# Patient Record
Sex: Female | Born: 1970 | Race: White | Hispanic: No | Marital: Married | Smoking: Never smoker
Health system: Southern US, Community
[De-identification: ages and names within clinical notes are randomized; demographics above are authoritative.]

## PROBLEM LIST (undated history)

## (undated) DIAGNOSIS — J189 Pneumonia, unspecified organism: Secondary | ICD-10-CM

## (undated) DIAGNOSIS — Z800 Family history of malignant neoplasm of digestive organs: Secondary | ICD-10-CM

## (undated) DIAGNOSIS — C801 Malignant (primary) neoplasm, unspecified: Secondary | ICD-10-CM

## (undated) DIAGNOSIS — Z8042 Family history of malignant neoplasm of prostate: Secondary | ICD-10-CM

## (undated) DIAGNOSIS — Z803 Family history of malignant neoplasm of breast: Secondary | ICD-10-CM

## (undated) DIAGNOSIS — C50919 Malignant neoplasm of unspecified site of unspecified female breast: Secondary | ICD-10-CM

## (undated) DIAGNOSIS — Z923 Personal history of irradiation: Secondary | ICD-10-CM

## (undated) HISTORY — DX: Personal history of irradiation: Z92.3

## (undated) HISTORY — PX: BREAST EXCISIONAL BIOPSY: SUR124

## (undated) HISTORY — DX: Family history of malignant neoplasm of prostate: Z80.42

## (undated) HISTORY — DX: Family history of malignant neoplasm of digestive organs: Z80.0

## (undated) HISTORY — PX: BREAST LUMPECTOMY: SHX2

## (undated) HISTORY — DX: Family history of malignant neoplasm of breast: Z80.3

## (undated) HISTORY — DX: Malignant neoplasm of unspecified site of unspecified female breast: C50.919

## (undated) HISTORY — PX: CHOLECYSTECTOMY: SHX55

## (undated) HISTORY — PX: TONSILLECTOMY: SUR1361

---

## 1997-09-13 ENCOUNTER — Inpatient Hospital Stay (HOSPITAL_COMMUNITY): Admission: AD | Admit: 1997-09-13 | Discharge: 1997-09-13 | Payer: Self-pay | Admitting: Obstetrics and Gynecology

## 1997-12-02 ENCOUNTER — Inpatient Hospital Stay (HOSPITAL_COMMUNITY): Admission: AD | Admit: 1997-12-02 | Discharge: 1997-12-07 | Payer: Self-pay | Admitting: Obstetrics and Gynecology

## 1998-01-05 ENCOUNTER — Encounter (HOSPITAL_COMMUNITY): Admission: RE | Admit: 1998-01-05 | Discharge: 1998-04-05 | Payer: Self-pay | Admitting: Obstetrics and Gynecology

## 1998-01-11 ENCOUNTER — Other Ambulatory Visit: Admission: RE | Admit: 1998-01-11 | Discharge: 1998-01-11 | Payer: Self-pay | Admitting: Obstetrics and Gynecology

## 1998-08-24 ENCOUNTER — Other Ambulatory Visit: Admission: RE | Admit: 1998-08-24 | Discharge: 1998-08-24 | Payer: Self-pay | Admitting: Obstetrics and Gynecology

## 1999-03-19 ENCOUNTER — Inpatient Hospital Stay (HOSPITAL_COMMUNITY): Admission: AD | Admit: 1999-03-19 | Discharge: 1999-03-19 | Payer: Self-pay | Admitting: Obstetrics and Gynecology

## 1999-03-22 ENCOUNTER — Inpatient Hospital Stay (HOSPITAL_COMMUNITY): Admission: AD | Admit: 1999-03-22 | Discharge: 1999-03-24 | Payer: Self-pay | Admitting: Obstetrics and Gynecology

## 1999-04-27 ENCOUNTER — Other Ambulatory Visit: Admission: RE | Admit: 1999-04-27 | Discharge: 1999-04-27 | Payer: Self-pay | Admitting: Obstetrics and Gynecology

## 1999-07-06 ENCOUNTER — Encounter (HOSPITAL_COMMUNITY): Admission: RE | Admit: 1999-07-06 | Discharge: 1999-10-04 | Payer: Self-pay | Admitting: Obstetrics and Gynecology

## 1999-11-30 ENCOUNTER — Other Ambulatory Visit: Admission: RE | Admit: 1999-11-30 | Discharge: 1999-11-30 | Payer: Self-pay | Admitting: Obstetrics and Gynecology

## 2000-06-19 ENCOUNTER — Other Ambulatory Visit: Admission: RE | Admit: 2000-06-19 | Discharge: 2000-06-19 | Payer: Self-pay | Admitting: Obstetrics and Gynecology

## 2001-06-30 ENCOUNTER — Other Ambulatory Visit: Admission: RE | Admit: 2001-06-30 | Discharge: 2001-06-30 | Payer: Self-pay | Admitting: Obstetrics and Gynecology

## 2002-09-10 ENCOUNTER — Other Ambulatory Visit: Admission: RE | Admit: 2002-09-10 | Discharge: 2002-09-10 | Payer: Self-pay | Admitting: Obstetrics and Gynecology

## 2003-01-14 ENCOUNTER — Ambulatory Visit (HOSPITAL_COMMUNITY): Admission: RE | Admit: 2003-01-14 | Discharge: 2003-01-14 | Payer: Self-pay | Admitting: Obstetrics and Gynecology

## 2003-01-14 ENCOUNTER — Encounter: Payer: Self-pay | Admitting: Obstetrics and Gynecology

## 2015-06-10 ENCOUNTER — Other Ambulatory Visit: Payer: Self-pay | Admitting: Obstetrics and Gynecology

## 2015-06-10 DIAGNOSIS — N6489 Other specified disorders of breast: Secondary | ICD-10-CM

## 2015-06-16 ENCOUNTER — Ambulatory Visit
Admission: RE | Admit: 2015-06-16 | Discharge: 2015-06-16 | Disposition: A | Discharge status: Home / Self Care | Payer: Managed Care, Other (non HMO) | Source: Ambulatory Visit | Attending: Obstetrics and Gynecology | Admitting: Obstetrics and Gynecology

## 2015-06-16 ENCOUNTER — Other Ambulatory Visit: Payer: Self-pay | Admitting: Obstetrics and Gynecology

## 2015-06-16 DIAGNOSIS — N6489 Other specified disorders of breast: Secondary | ICD-10-CM

## 2015-08-18 ENCOUNTER — Other Ambulatory Visit: Payer: Self-pay | Admitting: Obstetrics and Gynecology

## 2015-08-18 DIAGNOSIS — E041 Nontoxic single thyroid nodule: Secondary | ICD-10-CM

## 2015-08-23 ENCOUNTER — Ambulatory Visit
Admission: RE | Admit: 2015-08-23 | Discharge: 2015-08-23 | Disposition: A | Discharge status: Home / Self Care | Payer: Managed Care, Other (non HMO) | Source: Ambulatory Visit | Attending: Obstetrics and Gynecology | Admitting: Obstetrics and Gynecology

## 2015-08-23 DIAGNOSIS — E041 Nontoxic single thyroid nodule: Secondary | ICD-10-CM

## 2016-08-23 ENCOUNTER — Other Ambulatory Visit: Payer: Self-pay | Admitting: Obstetrics and Gynecology

## 2016-08-23 ENCOUNTER — Other Ambulatory Visit: Payer: Self-pay | Admitting: Neurosurgery

## 2016-08-23 DIAGNOSIS — Z1509 Genetic susceptibility to other malignant neoplasm: Principal | ICD-10-CM

## 2016-08-23 DIAGNOSIS — Z1501 Genetic susceptibility to malignant neoplasm of breast: Secondary | ICD-10-CM

## 2016-12-27 ENCOUNTER — Ambulatory Visit
Admission: RE | Admit: 2016-12-27 | Discharge: 2016-12-27 | Disposition: A | Discharge status: Home / Self Care | Payer: Managed Care, Other (non HMO) | Source: Ambulatory Visit | Attending: Obstetrics and Gynecology | Admitting: Obstetrics and Gynecology

## 2016-12-27 DIAGNOSIS — Z1509 Genetic susceptibility to other malignant neoplasm: Principal | ICD-10-CM

## 2016-12-27 DIAGNOSIS — Z1501 Genetic susceptibility to malignant neoplasm of breast: Secondary | ICD-10-CM

## 2016-12-27 MED ORDER — GADOBENATE DIMEGLUMINE 529 MG/ML IV SOLN
15.0000 mL | Freq: Once | INTRAVENOUS | Status: AC | PRN
  Administered 2016-12-27: 15 mL via INTRAVENOUS

## 2017-01-01 ENCOUNTER — Other Ambulatory Visit: Payer: Self-pay | Admitting: Obstetrics and Gynecology

## 2017-01-01 DIAGNOSIS — R928 Other abnormal and inconclusive findings on diagnostic imaging of breast: Secondary | ICD-10-CM

## 2017-01-07 ENCOUNTER — Inpatient Hospital Stay
Admission: RE | Admit: 2017-01-07 | Discharge: 2017-01-07 | Disposition: A | Discharge status: Home / Self Care | Payer: Managed Care, Other (non HMO) | Source: Ambulatory Visit | Attending: Obstetrics and Gynecology | Admitting: Obstetrics and Gynecology

## 2017-01-10 ENCOUNTER — Ambulatory Visit
Admission: RE | Admit: 2017-01-10 | Discharge: 2017-01-10 | Disposition: A | Discharge status: Home / Self Care | Payer: Managed Care, Other (non HMO) | Source: Ambulatory Visit | Attending: Obstetrics and Gynecology | Admitting: Obstetrics and Gynecology

## 2017-01-10 ENCOUNTER — Other Ambulatory Visit (HOSPITAL_COMMUNITY): Payer: Self-pay | Admitting: Diagnostic Radiology

## 2017-01-10 ENCOUNTER — Other Ambulatory Visit: Payer: Self-pay | Admitting: Obstetrics and Gynecology

## 2017-01-10 DIAGNOSIS — R928 Other abnormal and inconclusive findings on diagnostic imaging of breast: Secondary | ICD-10-CM

## 2017-01-10 MED ORDER — GADOBENATE DIMEGLUMINE 529 MG/ML IV SOLN
15.0000 mL | Freq: Once | INTRAVENOUS | Status: AC | PRN
  Administered 2017-01-10: 15 mL via INTRAVENOUS

## 2017-08-21 ENCOUNTER — Other Ambulatory Visit: Payer: Self-pay | Admitting: Family Medicine

## 2017-08-21 DIAGNOSIS — R000 Tachycardia, unspecified: Secondary | ICD-10-CM

## 2017-08-28 ENCOUNTER — Ambulatory Visit (INDEPENDENT_AMBULATORY_CARE_PROVIDER_SITE_OTHER): Payer: Managed Care, Other (non HMO)

## 2017-08-28 DIAGNOSIS — R000 Tachycardia, unspecified: Secondary | ICD-10-CM | POA: Diagnosis not present

## 2017-12-10 ENCOUNTER — Other Ambulatory Visit: Payer: Self-pay | Admitting: General Surgery

## 2017-12-10 DIAGNOSIS — N6021 Fibroadenosis of right breast: Secondary | ICD-10-CM

## 2018-01-01 ENCOUNTER — Ambulatory Visit
Admission: RE | Admit: 2018-01-01 | Discharge: 2018-01-01 | Disposition: A | Discharge status: Home / Self Care | Payer: Managed Care, Other (non HMO) | Source: Ambulatory Visit | Attending: General Surgery | Admitting: General Surgery

## 2018-01-01 DIAGNOSIS — N6021 Fibroadenosis of right breast: Secondary | ICD-10-CM

## 2018-01-01 MED ORDER — GADOBENATE DIMEGLUMINE 529 MG/ML IV SOLN
14.0000 mL | Freq: Once | INTRAVENOUS | Status: AC | PRN
  Administered 2018-01-01: 14 mL via INTRAVENOUS

## 2018-09-23 ENCOUNTER — Other Ambulatory Visit (HOSPITAL_COMMUNITY): Payer: Self-pay | Admitting: Obstetrics and Gynecology

## 2018-09-23 DIAGNOSIS — R011 Cardiac murmur, unspecified: Secondary | ICD-10-CM

## 2018-09-24 ENCOUNTER — Other Ambulatory Visit: Payer: Self-pay | Admitting: Obstetrics and Gynecology

## 2018-09-24 DIAGNOSIS — Z9189 Other specified personal risk factors, not elsewhere classified: Secondary | ICD-10-CM

## 2018-09-24 DIAGNOSIS — Z803 Family history of malignant neoplasm of breast: Secondary | ICD-10-CM

## 2018-09-29 ENCOUNTER — Ambulatory Visit (HOSPITAL_COMMUNITY): Payer: Managed Care, Other (non HMO)

## 2018-10-06 ENCOUNTER — Other Ambulatory Visit: Payer: Self-pay

## 2018-10-06 ENCOUNTER — Ambulatory Visit (HOSPITAL_COMMUNITY)
Admission: RE | Admit: 2018-10-06 | Discharge: 2018-10-06 | Disposition: A | Discharge status: Home / Self Care | Payer: Managed Care, Other (non HMO) | Source: Ambulatory Visit | Attending: Obstetrics and Gynecology | Admitting: Obstetrics and Gynecology

## 2018-10-06 DIAGNOSIS — I080 Rheumatic disorders of both mitral and aortic valves: Secondary | ICD-10-CM | POA: Insufficient documentation

## 2018-10-06 DIAGNOSIS — R011 Cardiac murmur, unspecified: Secondary | ICD-10-CM | POA: Diagnosis present

## 2019-03-09 DIAGNOSIS — M25561 Pain in right knee: Secondary | ICD-10-CM | POA: Insufficient documentation

## 2019-05-14 ENCOUNTER — Other Ambulatory Visit: Payer: Self-pay | Admitting: Sports Medicine

## 2019-05-14 DIAGNOSIS — M25561 Pain in right knee: Secondary | ICD-10-CM

## 2019-06-11 ENCOUNTER — Other Ambulatory Visit: Payer: Self-pay | Admitting: Obstetrics and Gynecology

## 2019-06-11 DIAGNOSIS — N6310 Unspecified lump in the right breast, unspecified quadrant: Secondary | ICD-10-CM

## 2019-06-11 DIAGNOSIS — N6320 Unspecified lump in the left breast, unspecified quadrant: Secondary | ICD-10-CM

## 2019-06-23 ENCOUNTER — Ambulatory Visit
Admission: RE | Admit: 2019-06-23 | Discharge: 2019-06-23 | Disposition: A | Discharge status: Home / Self Care | Payer: Managed Care, Other (non HMO) | Source: Ambulatory Visit | Attending: Obstetrics and Gynecology | Admitting: Obstetrics and Gynecology

## 2019-06-23 ENCOUNTER — Other Ambulatory Visit: Payer: Self-pay

## 2019-06-23 DIAGNOSIS — N6310 Unspecified lump in the right breast, unspecified quadrant: Secondary | ICD-10-CM

## 2019-06-23 DIAGNOSIS — N6320 Unspecified lump in the left breast, unspecified quadrant: Secondary | ICD-10-CM

## 2019-06-30 ENCOUNTER — Other Ambulatory Visit: Payer: Self-pay

## 2020-11-21 ENCOUNTER — Ambulatory Visit (INDEPENDENT_AMBULATORY_CARE_PROVIDER_SITE_OTHER): Payer: Managed Care, Other (non HMO)

## 2020-11-21 ENCOUNTER — Encounter: Payer: Self-pay | Admitting: Podiatry

## 2020-11-21 ENCOUNTER — Ambulatory Visit (INDEPENDENT_AMBULATORY_CARE_PROVIDER_SITE_OTHER): Payer: Managed Care, Other (non HMO) | Admitting: Podiatry

## 2020-11-21 ENCOUNTER — Other Ambulatory Visit: Payer: Self-pay

## 2020-11-21 DIAGNOSIS — L84 Corns and callosities: Secondary | ICD-10-CM | POA: Diagnosis not present

## 2020-11-21 DIAGNOSIS — R52 Pain, unspecified: Secondary | ICD-10-CM

## 2020-11-21 DIAGNOSIS — M7671 Peroneal tendinitis, right leg: Secondary | ICD-10-CM

## 2020-11-21 DIAGNOSIS — M722 Plantar fascial fibromatosis: Secondary | ICD-10-CM

## 2020-11-21 NOTE — Patient Instructions (Signed)
Look for urea 40% cream or ointment and apply to the thickened dry skin / calluses. This can be bought over the counter, at a pharmacy or online such as [employer].    Look for shoes that are for "stability control" "motion control" or "over-supinators"

## 2020-11-22 ENCOUNTER — Ambulatory Visit: Payer: Managed Care, Other (non HMO) | Admitting: Podiatry

## 2020-11-23 ENCOUNTER — Encounter: Payer: Self-pay | Admitting: Podiatry

## 2020-11-23 NOTE — Progress Notes (Signed)
  Subjective:  Patient ID: Veronica Santos, female    DOB: Apr 23, 1970,  MRN: [id number]  Chief Complaint  Patient presents with   Foot Pain    Right heel pain x 1 month    50 y.o. female presents with the above complaint. History confirmed with patient.  She also has a small hard lesion on the side of the heel she tried to shave it a little bit.  She describes the ankle pain as burning and radiating pain that is worse with activity, overall has been getting better  Objective:  Physical Exam: warm, good capillary refill, no trophic changes or ulcerative lesions, normal DP and PT pulses, and normal sensory exam. Left Foot: normal exam, no swelling, tenderness, instability; ligaments intact, full range of motion of all ankle/foot joints Right Foot:  Mild pain over the peroneal tendons and small callus on the plantar lateral heel consistent with a porokeratosis    Radiographs: Multiple views x-ray of the right foot: no fracture, dislocation, swelling or degenerative changes noted Assessment:   1. Peroneal tendinitis of right lower leg   2. Callus of foot      Plan:  Patient was evaluated and treated and all questions answered.  Discussed the etiology and treatment options for peroneal tendinitis including stretching, formal physical therapy with an eccentric exercises therapy plan, supportive shoegears such as a running shoe or sneaker, topical and oral medications.  Overall has been getting much better for her think a change in shoe gears with stability control and motion control to prevent oversupination will help her quite a bit.  All symptomatic hyperkeratoses were safely debrided with a sterile #15 blade to patient's level of comfort without incident. We discussed preventative and palliative care of these lesions including supportive and accommodative shoegear, padding, prefabricated and custom molded accommodative orthoses, use of a pumice stone and lotions/creams daily.  Recommended  urea cream for these areas with a pumice stone   Return if symptoms worsen or fail to improve.

## 2021-01-04 ENCOUNTER — Other Ambulatory Visit: Payer: Self-pay | Admitting: Obstetrics and Gynecology

## 2021-01-04 DIAGNOSIS — Z803 Family history of malignant neoplasm of breast: Secondary | ICD-10-CM

## 2021-01-27 ENCOUNTER — Ambulatory Visit
Admission: RE | Admit: 2021-01-27 | Discharge: 2021-01-27 | Disposition: A | Discharge status: Home / Self Care | Payer: Managed Care, Other (non HMO) | Source: Ambulatory Visit | Attending: Obstetrics and Gynecology | Admitting: Obstetrics and Gynecology

## 2021-01-27 DIAGNOSIS — Z803 Family history of malignant neoplasm of breast: Secondary | ICD-10-CM

## 2021-01-27 MED ORDER — GADOBUTROL 1 MMOL/ML IV SOLN
7.0000 mL | Freq: Once | INTRAVENOUS | Status: AC | PRN
  Administered 2021-01-27: 7 mL via INTRAVENOUS

## 2021-01-30 ENCOUNTER — Other Ambulatory Visit: Payer: Self-pay | Admitting: Obstetrics and Gynecology

## 2021-01-30 DIAGNOSIS — R9389 Abnormal findings on diagnostic imaging of other specified body structures: Secondary | ICD-10-CM

## 2021-02-08 ENCOUNTER — Ambulatory Visit
Admission: RE | Admit: 2021-02-08 | Discharge: 2021-02-08 | Disposition: A | Discharge status: Home / Self Care | Payer: Managed Care, Other (non HMO) | Source: Ambulatory Visit | Attending: Obstetrics and Gynecology | Admitting: Obstetrics and Gynecology

## 2021-02-08 ENCOUNTER — Other Ambulatory Visit: Payer: Self-pay

## 2021-02-08 ENCOUNTER — Other Ambulatory Visit (HOSPITAL_COMMUNITY): Payer: Self-pay | Admitting: Diagnostic Radiology

## 2021-02-08 DIAGNOSIS — R9389 Abnormal findings on diagnostic imaging of other specified body structures: Secondary | ICD-10-CM

## 2021-02-08 HISTORY — PX: BREAST BIOPSY: SHX20

## 2021-02-08 MED ORDER — GADOBUTROL 1 MMOL/ML IV SOLN
7.0000 mL | Freq: Once | INTRAVENOUS | Status: AC | PRN
  Administered 2021-02-08: 7 mL via INTRAVENOUS

## 2021-02-13 ENCOUNTER — Encounter: Payer: Self-pay | Admitting: *Deleted

## 2021-02-13 ENCOUNTER — Other Ambulatory Visit: Payer: Self-pay | Admitting: *Deleted

## 2021-02-13 ENCOUNTER — Telehealth: Payer: Self-pay | Admitting: *Deleted

## 2021-02-13 NOTE — Telephone Encounter (Signed)
Received call from patient stating she was just diagnosed with breast cancer and wanted some information on her next steps. She is scheduled with Dr. Marlou Starks 11/29. She has seen him in the past for a benign lesion.  Discussed navigation resources, needs and concerns. Gave contact information.  She is requesting Dr. Lindi Adie and we will get her an appt with him and radiation oncology (she requests dr. Isidore Moos).  Encouraged her to should she have any questions or concerns. Patient verbalized understanding.

## 2021-02-14 ENCOUNTER — Other Ambulatory Visit: Payer: Self-pay | Admitting: *Deleted

## 2021-02-14 ENCOUNTER — Telehealth: Payer: Self-pay | Admitting: Hematology and Oncology

## 2021-02-14 NOTE — Telephone Encounter (Signed)
Scheduled appt per 11/21 staff msg from [employer]. Pt is aware of appt date and time.

## 2021-02-21 ENCOUNTER — Other Ambulatory Visit: Payer: Self-pay | Admitting: *Deleted

## 2021-02-21 DIAGNOSIS — C50511 Malignant neoplasm of lower-outer quadrant of right female breast: Secondary | ICD-10-CM | POA: Insufficient documentation

## 2021-02-21 DIAGNOSIS — Z170 Estrogen receptor positive status [ER+]: Secondary | ICD-10-CM | POA: Insufficient documentation

## 2021-02-21 NOTE — Progress Notes (Signed)
Radiation Oncology         [phone] ________________________________  Initial Outpatient Consultation  Name: Veronica Santos MRN: [id number]  Date: 02/22/2021  DOB: [birth year]  CC:Gaynelle Arabian, MD  Nicholas Lose, MD   REFERRING PHYSICIAN: Nicholas Lose, MD  DIAGNOSIS:    ICD-10-CM   1. Malignant neoplasm of lower-outer quadrant of right breast of female, estrogen receptor positive ([place])  C50.511 Ambulatory Referral to Genetics   Z17.0       Cancer Staging  Malignant neoplasm of lower-outer quadrant of right breast of female, estrogen receptor positive ([place]) Staging form: Breast, AJCC 8th Edition - Clinical stage from 02/22/2021: Stage IA (cT1c, cN0, cM0, G2, ER+, PR+, HER2-) - Signed by Nicholas Lose, MD on 02/22/2021 Stage prefix: Initial diagnosis Histologic grading system: 3 grade system   CHIEF COMPLAINT: Here to discuss management of right breast cancer  HISTORY OF PRESENT ILLNESS::Veronica Santos is a 50 y.o. female who presented with breast abnormality on the following imaging: bilateral breast MRI on the date of 01/27/21 which showed a dominant mass in the inferolateral right breast measuring 11 mm, and a mass in the left breast retroareolar region measuring 8.6 mm.  MRI was prompted due to the patient's high genetic risk of breast cancer (increased lifetime risk between 23 and 29%).  Lymph nodes appeared negative on MRI.  Right breast LOQ biopsy on the date of 02/08/21 showed invasive mammary carcinoma measuring 0.7 cm in the greatest linear extent, and mammary carcinoma in-situ. Left breast retroareolar biopsy also performed revealed tubular adenoma.  ER status: 100% positive; PR status 100% positive, both with strong staining intensity, Her2 status negative; Grade 2.  No lymph nodes were examined.   The patient met with Dr. Marlou Starks on 02/21/21 to discuss further treatment options. Following discussion of her options, the patient voiced interest in proceeding with breast  conserving surgery. Dr. Marlou Starks also recommended SLN biopsies and that the patient may likely benefit from neoadjuvant chemotherapy upfront due to her high genetic risk factors.  She has not yet been referred to genetic counseling.  Her receptor status just became available and was not accessible at the time that she consulted with Dr. Marlou Starks.  She reports that her partner has had a vasectomy and denies current pregnancy.  She has not gone through menopause.  She denies any pain.  She denies hormonal birth control.  She denies prior radiation.  PREVIOUS RADIATION THERAPY: No  PAST MEDICAL HISTORY: Pain in right knee  PAST SURGICAL HISTORY: Past Surgical History:  Procedure Laterality Date   BREAST BIOPSY Left 02/08/2021   BREAST BIOPSY Right 02/08/2021    FAMILY HISTORY: Positive family history for cancer, genetic counseling pending  SOCIAL HISTORY:  reports that she has never smoked. She has never used smokeless tobacco. She reports current alcohol use. She reports that she does not use drugs.  ALLERGIES: Penicillins and Sulfa antibiotics  MEDICATIONS: She is not currently taking medication  REVIEW OF SYSTEMS: As above in HPI.   PHYSICAL EXAM:  height is $RemoveB'5\' 7"'uhrNVxZm$  (1.702 m) and weight is 156 lb 3.2 oz (70.9 kg). Her temperature is 97.9 F (36.6 C). Her blood pressure is 129/82 and her pulse is 88. Her respiration is 18 and oxygen saturation is 98%.   General: Alert and oriented, in no acute distress  Neck: Neck is supple, no palpable cervical or supraclavicular lymphadenopathy. Heart: Regular in rate and rhythm with no murmurs, rubs, or gallops. Chest: Clear to auscultation bilaterally, with no rhonchi, wheezes,  or rales. Extremities: No cyanosis or edema. Lymphatics: see Neck Exam Skin: No concerning lesions. Musculoskeletal: symmetric strength and muscle tone throughout. Neurologic: Cranial nerves II through XII are grossly intact. No obvious focalities. Speech is fluent. Coordination is  intact. Psychiatric: Judgment and insight are intact. Affect is appropriate. Breasts: There is a subcentimeter spherical mass superficially in the right breast at the 6:30 position.  There is firmness in the 9:00 region of the left retroareolar tissue. No other palpable masses appreciated in the breasts or axillae bilaterally.  There is bruising associated with bilateral breast biopsies   ECOG = 0  0 - Asymptomatic (Fully active, able to carry on all predisease activities without restriction)  1 - Symptomatic but completely ambulatory (Restricted in physically strenuous activity but ambulatory and able to carry out work of a light or sedentary nature. For example, light housework, office work)  2 - Symptomatic, <50% in bed during the day (Ambulatory and capable of all self care but unable to carry out any work activities. Up and about more than 50% of waking hours)  3 - Symptomatic, >50% in bed, but not bedbound (Capable of only limited self-care, confined to bed or chair 50% or more of waking hours)  4 - Bedbound (Completely disabled. Cannot carry on any self-care. Totally confined to bed or chair)  5 - Death   Eustace Pen MM, Creech RH, Tormey DC, et al. [phone]). "Toxicity and response criteria of the [facility]". [place] Oncol. 5 (6): 649-55   LABORATORY DATA:  No results found for: WBC, HGB, HCT, MCV, PLT CMP  No results found for: NA, K, CL, CO2, GLUCOSE, BUN, CREATININE, CALCIUM, PROT, ALBUMIN, AST, ALT, ALKPHOS, BILITOT, GFRNONAA, GFRAA       RADIOGRAPHY: MR BREAST BILATERAL W WO CONTRAST INC CAD  Result Date: 01/27/2021 CLINICAL DATA:  High risk screening. Increased lifetime risk of breast cancer calculated between 23 and 29%. History of right breast needle biopsy demonstrating a complex sclerosing lesion. Family history of breast cancer. LABS:  None EXAM: BILATERAL BREAST MRI WITH AND WITHOUT CONTRAST TECHNIQUE: Multiplanar, multisequence MR images of both  breasts were obtained prior to and following the intravenous administration of 7 ml of Gadavist Three-dimensional MR images were rendered by post-processing of the original MR data on an independent workstation. The three-dimensional MR images were interpreted, and findings are reported in the following complete MRI report for this study. Three dimensional images were evaluated at the independent interpreting workstation using the DynaCAD thin client. COMPARISON:  Previous exam(s). FINDINGS: Breast composition: d. Extreme fibroglandular tissue. Background parenchymal enhancement: Marked Right breast: Marked background enhancement is identified with innumerable foci and small masses. There is a dominant indeterminate mass in the inferolateral right breast seen on series 6, image 115 measuring up to 11 mm. No other discrete abnormalities identified on the right. Left breast: Marked background enhancement is identified within numerable small foci and small masses. There is a mass in the left retroareolar region on series 6, image 77 which is indeterminate, measuring up to 8.6 mm. No other suspicious discrete findings on the left. Lymph nodes: No abnormal appearing lymph nodes. Ancillary findings:  None. IMPRESSION: The study is limited due to marked background enhancement as above. There is a dominant mass in the inferolateral right breast on series 6, image 115 measuring up to 11 mm. There is a mass in the left retroareolar region on series 6, image 77 measuring 8.6 mm. RECOMMENDATION: Recommend MRI guided biopsy of  the right breast mass on series 6, image 115. Recommend MRI guided biopsy of the left breast mass on series 6, image 77. BI-RADS CATEGORY  4: Suspicious. Electronically Signed   By: Dorise Bullion III M.D.   On: 01/27/2021 15:31  MM CLIP PLACEMENT LEFT  Result Date: 02/08/2021 CLINICAL DATA:  Status post bilateral MRI guided core needle breast biopsies. EXAM: 3D DIAGNOSTIC BILATERAL MAMMOGRAM POST  MRI BIOPSY X 2 COMPARISON:  Previous exam(s). FINDINGS: 3D Mammographic images were obtained following MR guided biopsy of a mass in each breast. The cylinder shaped biopsy marking clip is located approximately 3.5 cm medial to the expected location of the biopsy on the right. The cylinder-shaped biopsy marker clip on the left is approximately 7 mm anterior to the expected location of the biopsy on the left. IMPRESSION: Approximately 3.5 cm of medial migration of the cylinder shaped clip in the right breast and approximately 7 mm of anterior migration of the cylinder-shaped biopsy clip in the left breast. Final Assessment: Post Procedure Mammograms for Marker Placement Electronically Signed   By: Claudie Revering M.D.   On: 02/08/2021 10:45  MM CLIP PLACEMENT RIGHT  Result Date: 02/08/2021 CLINICAL DATA:  Status post bilateral MRI guided core needle breast biopsies. EXAM: 3D DIAGNOSTIC BILATERAL MAMMOGRAM POST MRI BIOPSY X 2 COMPARISON:  Previous exam(s). FINDINGS: 3D Mammographic images were obtained following MR guided biopsy of a mass in each breast. The cylinder shaped biopsy marking clip is located approximately 3.5 cm medial to the expected location of the biopsy on the right. The cylinder-shaped biopsy marker clip on the left is approximately 7 mm anterior to the expected location of the biopsy on the left. IMPRESSION: Approximately 3.5 cm of medial migration of the cylinder shaped clip in the right breast and approximately 7 mm of anterior migration of the cylinder-shaped biopsy clip in the left breast. Final Assessment: Post Procedure Mammograms for Marker Placement Electronically Signed   By: Claudie Revering M.D.   On: 02/08/2021 10:45  MR LT BREAST BX W LOC DEV 1ST LESION IMAGE BX SPEC MR GUIDE  Addendum Date: 02/09/2021   ADDENDUM REPORT: 02/09/2021 14:44 ADDENDUM: Pathology revealed GRADE II INVASIVE MAMMARY CARCINOMA, MAMMARY CARCINOMA IN SITU, CALCIFICATIONS of the RIGHT breast, lower outer  quadrant, (cylinder clip). This was found to be concordant by Dr. Claudie Revering. 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 This was found to be concordant by Dr. Claudie Revering. Pathology results were discussed with the patient by telephone. The patient reported doing well after the biopsies with tenderness at BILATERAL sites, and bleeding and bruising on the RIGHT. Post biopsy instructions and care were reviewed and questions were answered. The patient was encouraged to call [facility] for any additional concerns. My direct phone number was provided. Surgical consultation has been arranged with Dr. Autumn Messing, per patient request, at [facility] on February 21, 2021. Pathology results reported by Terie Purser, RN on 02/09/2021. Electronically Signed   By: Claudie Revering M.D.   On: 02/09/2021 14:44   Result Date: 02/09/2021 CLINICAL DATA:  11 mm indeterminate mass in the lower outer quadrant of the right breast and 9 mm indeterminate mass in the retroareolar left breast on a recent high risk screening MRI. EXAM: MRI GUIDED CORE NEEDLE BIOPSIES OF BOTH BREASTS TECHNIQUE: Multiplanar, multisequence MR imaging of both breasts was performed both before and after administration of intravenous contrast. CONTRAST:  40mL GADAVIST GADOBUTROL 1 MMOL/ML  IV SOLN COMPARISON:  Previous exams. FINDINGS: I met with the patient, and we discussed the procedure of MRI guided biopsy, including risks, benefits, and alternatives. Specifically, we discussed the risks of infection, bleeding, tissue injury, clip migration, and inadequate sampling. Informed, written consent was given. The usual time out protocol was performed immediately prior to the procedures. SITE 1: 11 MM MASS IN THE LOWER OUTER QUADRANT OF THE RIGHT BREAST Using sterile technique, 1% Lidocaine, MRI guidance, and a 9 gauge vacuum assisted device, biopsy was performed of the recently  demonstrated 11 mm mass in the lower outer quadrant of the right breast using a lateral approach. At the conclusion of the procedure, a cylinder shaped tissue marker clip was deployed into the biopsy cavity. Follow-up 2-view mammogram was performed and dictated separately. SITE 2: 9 MM MASS IN THE RETROAREOLAR LEFT BREAST Using sterile technique, 1% Lidocaine, MRI guidance, and a 9 gauge vacuum assisted device, biopsy was performed of the recently demonstrated 9 mm mass in the lateral retroareolar left breast using a lateral approach. At the conclusion of the procedure, a cylinder shaped tissue marker clip was deployed into the biopsy cavity. Follow-up 2-view mammogram was performed and dictated separately. IMPRESSION: MRI guided biopsies of both breasts.  No apparent complications. Electronically Signed: By: Claudie Revering M.D. On: 02/08/2021 10:34  MR RT BREAST BX W LOC DEV 1ST LESION IMAGE BX SPEC MR GUIDE  Addendum Date: 02/09/2021   ADDENDUM REPORT: 02/09/2021 14:44 ADDENDUM: Pathology revealed GRADE II INVASIVE MAMMARY CARCINOMA, MAMMARY CARCINOMA IN SITU, CALCIFICATIONS of the RIGHT breast, lower outer quadrant, (cylinder clip). This was found to be concordant by Dr. Claudie Revering. 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 This was found to be concordant by Dr. Claudie Revering. Pathology results were discussed with the patient by telephone. The patient reported doing well after the biopsies with tenderness at BILATERAL sites, and bleeding and bruising on the RIGHT. Post biopsy instructions and care were reviewed and questions were answered. The patient was encouraged to call [facility] for any additional concerns. My direct phone number was provided. Surgical consultation has been arranged with Dr. Autumn Messing, per patient request, at [facility] on February 21, 2021. Pathology results reported by Terie Purser, RN on 02/09/2021.  Electronically Signed   By: Claudie Revering M.D.   On: 02/09/2021 14:44   Result Date: 02/09/2021 CLINICAL DATA:  11 mm indeterminate mass in the lower outer quadrant of the right breast and 9 mm indeterminate mass in the retroareolar left breast on a recent high risk screening MRI. EXAM: MRI GUIDED CORE NEEDLE BIOPSIES OF BOTH BREASTS TECHNIQUE: Multiplanar, multisequence MR imaging of both breasts was performed both before and after administration of intravenous contrast. CONTRAST:  66mL GADAVIST GADOBUTROL 1 MMOL/ML IV SOLN COMPARISON:  Previous exams. FINDINGS: I met with the patient, and we discussed the procedure of MRI guided biopsy, including risks, benefits, and alternatives. Specifically, we discussed the risks of infection, bleeding, tissue injury, clip migration, and inadequate sampling. Informed, written consent was given. The usual time out protocol was performed immediately prior to the procedures. SITE 1: 11 MM MASS IN THE LOWER OUTER QUADRANT OF THE RIGHT BREAST Using sterile technique, 1% Lidocaine, MRI guidance, and a 9 gauge vacuum assisted device, biopsy was performed of the recently demonstrated 11 mm mass in the lower outer quadrant of the right breast using a lateral approach. At the conclusion of the procedure, a cylinder  shaped tissue marker clip was deployed into the biopsy cavity. Follow-up 2-view mammogram was performed and dictated separately. SITE 2: 9 MM MASS IN THE RETROAREOLAR LEFT BREAST Using sterile technique, 1% Lidocaine, MRI guidance, and a 9 gauge vacuum assisted device, biopsy was performed of the recently demonstrated 9 mm mass in the lateral retroareolar left breast using a lateral approach. At the conclusion of the procedure, a cylinder shaped tissue marker clip was deployed into the biopsy cavity. Follow-up 2-view mammogram was performed and dictated separately. IMPRESSION: MRI guided biopsies of both breasts.  No apparent complications. Electronically Signed: By: Claudie Revering M.D. On: 02/08/2021 10:34     IMPRESSION/PLAN: This a delightful 50 year old woman with a new diagnosis of ER positive right breast cancer, stage I  She has been undergoing MRI screening due to elevated risk and family history.  I have placed a referral to genetic counseling.  She is open to pursuing bilateral mastectomies if her risk is found to be more elevated than originally estimated, after genetic testing  She understands that if she undergoes mastectomies it is unlikely that she would need postoperative radiation however we did review the indications for postmastectomy radiation.  We then had a lengthy discussion regarding breast conserving surgery and adjuvant therapy.  Fortunately her receptors are favorable and we are hopeful that she will not need chemotherapy.  Of course this will be determined under the guidance of Dr. Lindi Adie with likely Oncotype testing.  Assuming she undergoes breast conserving surgery, I recommend radiotherapy to the right breast  to reduce her risk of locoregional recurrence by 2/3.  We discussed that radiation would take approximately 4 weeks to complete and that I would give the patient a few weeks to heal following surgery before starting treatment planning.  If chemotherapy were to be given, this would precede radiotherapy. We spoke about acute effects including skin irritation and fatigue as well as much less common late effects including internal organ injury or irritation. We spoke about the latest technology that is used to minimize the risk of late effects for patients undergoing radiotherapy to the breast or chest wall. No guarantees of treatment were given. The patient is enthusiastic about proceeding with treatment. I look forward to participating in the patient's care.  We reviewed the risks benefits and side effects of radiotherapy to the breast in detail.  Consent form has been signed today.  I will await her referral back to me for postoperative  follow-up and eventual CT simulation/treatment planning.  On date of service, in total, I spent 55 minutes on this encounter. Patient was seen in person.   __________________________________________   Eppie Gibson, MD  This document serves as a record of services personally performed by Eppie Gibson, MD. It was created on her behalf by Roney Mans, a trained medical scribe. The creation of this record is based on the scribe's personal observations and the provider's statements to them. This document has been checked and approved by the attending provider.

## 2021-02-21 NOTE — Progress Notes (Signed)
Location of Breast Cancer:  Malignant neoplasm of lower-outer quadrant of right female breast, unspecified estrogen receptor status  Histology per Pathology Report:  (Definitive pathology TBD) Diagnosis 1. Breast, right, needle core biopsy, LOQ - INVASIVE MAMMARY CARCINOMA - MAMMARY CARCINOMA IN SITU - CALCIFICATIONS - SEE COMMENT 2. Breast, left, needle core biopsy, retroareolar - TUBULAR ADENOMA - NO MALIGNANCY IDENTIFIED  Receptor Status: ER(100%), PR (100%), Her2-neu (Negative via FISH), Ki-67(15%)  Did patient present with symptoms (if so, please note symptoms) or was this found on screening mammography?: Patient recently got a screening MRI for being high risk for breast cancer due to her family history. At that time she was found to have an 11 mm mass in the lower outer quadrant of the right breast. The lymph nodes looked normal  Past/Anticipated interventions by surgeon, if any:  02/21/2021 --Dr. Autumn Messing (office visit)  I have discussed with her in detail the different options for treatment of the cancer and at this point she favors breast conservation which I feel is reasonable.  She will be a good candidate for sentinel node biopsy as well.  I have discussed with her in detail the risks and benefits of the operation as well as some of the technical aspects including the use of a radioactive seed for localization and she understands.  We will wait for her tumor markers to be reported before proceeding with scheduling.  If she has unfavorable markers then she may benefit from neoadjuvant chemotherapy upfront.  She will meet with medical oncology tomorrow and we will finalize our plan once the tumor markers are back  Past/Anticipated interventions by medical oncology, if any:  Scheduled for consultation with Dr. Nicholas Lose later today  Lymphedema issues, if any:  Patient denies    Pain issues, if any: Patient denies   SAFETY ISSUES: Prior radiation? No Pacemaker/ICD?  No Possible current pregnancy? No--LMP: 02/12/2021 Is the patient on methotrexate? No  Current Complaints / other details:  Nothing else of note

## 2021-02-21 NOTE — Progress Notes (Signed)
[facility] CONSULT NOTE  Patient Care Team: Gaynelle Arabian, MD as PCP - General (Family Medicine) Rockwell Germany, RN as Oncology Nurse Navigator Tressie Ellis, Paulette Blanch, RN as Oncology Nurse Navigator Nicholas Lose, MD as Consulting Physician (Hematology and Oncology) Jovita Kussmaul, MD as Consulting Physician (General Surgery) Eppie Gibson, MD as Attending Physician (Radiation Oncology)  CHIEF COMPLAINTS/PURPOSE OF CONSULTATION:  Newly diagnosed right breast cancer  HISTORY OF PRESENTING ILLNESS:  Veronica Santos 50 y.o. female is here because of recent diagnosis of invasive mammary carcinoma of the right breast. MRI Breast on 01/27/2021 showed a dominant mass in the inferolateral right breast, and a mass in the left retroareolar breast. 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 She presents to the clinic today for initial evaluation and discussion of treatment options.   I reviewed her records extensively and collaborated the history with the patient.  SUMMARY OF ONCOLOGIC HISTORY: Oncology History  Malignant neoplasm of lower-outer quadrant of right breast of female, estrogen receptor positive ([place])  02/21/2021 Initial Diagnosis   MRI Breast: a dominant mass in the inferolateral right breast, and a mass in the left retroareolar breast. Biopsy: invasive mammary carcinoma and mammary carcinoma in situ with calcifications in the right breast, and no malignancy in the left breast.    02/22/2021 Cancer Staging   Staging form: Breast, AJCC 8th Edition - Clinical stage from 02/22/2021: Stage IA (cT1c, cN0, cM0, G2, ER+, PR+, HER2-) - Signed by Nicholas Lose, MD on 02/22/2021 Stage prefix: Initial diagnosis Histologic grading system: 3 grade system      MEDICAL HISTORY:  No past medical history on file.  SURGICAL HISTORY: Past Surgical History:   Procedure Laterality Date   BREAST BIOPSY Left 02/08/2021   BREAST BIOPSY Right 02/08/2021    SOCIAL HISTORY: Social History   Socioeconomic History   Marital status: Married    Spouse name: Not on file   Number of children: Not on file   Years of education: Not on file   Highest education level: Not on file  Occupational History   Not on file  Tobacco Use   Smoking status: Never   Smokeless tobacco: Never  Substance and Sexual Activity   Alcohol use: Yes   Drug use: Never   Sexual activity: Yes  Other Topics Concern   Not on file  Social History Narrative   Not on file   Social Determinants of Health   Financial Resource Strain: Not on file  Food Insecurity: Not on file  Transportation Needs: Not on file  Physical Activity: Not on file  Stress: Not on file  Social Connections: Not on file  Intimate Partner Violence: Not on file    FAMILY HISTORY: No family history on file.  ALLERGIES:  is allergic to penicillins and sulfa antibiotics.  MEDICATIONS:  No current outpatient medications on file.   No current facility-administered medications for this visit.    REVIEW OF SYSTEMS:   Constitutional: Denies fevers, chills or abnormal night sweats Eyes: Denies blurriness of vision, double vision or watery eyes Ears, nose, mouth, throat, and face: Denies mucositis or sore throat Respiratory: Denies cough, dyspnea or wheezes Cardiovascular: Denies palpitation, chest discomfort or lower extremity swelling Gastrointestinal:  Denies nausea, heartburn or change in bowel habits Skin: Denies abnormal skin rashes Lymphatics: Denies new lymphadenopathy or easy bruising Neurological:Denies numbness, tingling or new weaknesses Behavioral/Psych: Mood is  stable, no new changes  Breast:  Denies any palpable lumps or discharge All other systems were reviewed with the patient and are negative.  PHYSICAL EXAMINATION: ECOG PERFORMANCE STATUS: 1 - Symptomatic but completely  ambulatory  Vitals:   02/22/21 1517  BP: 118/82  Pulse: 72  Resp: 17  Temp: 97.7 F (36.5 C)  SpO2: 100%   Filed Weights   02/22/21 1517  Weight: 156 lb 1.6 oz (70.8 kg)     RADIOGRAPHIC STUDIES: I have personally reviewed the radiological reports and agreed with the findings in the report.  ASSESSMENT AND PLAN:  Malignant neoplasm of lower-outer quadrant of right breast of female, estrogen receptor positive ([place]) 01/27/2021: Breast MRI performed for high risk screening: Dominant mass inferolateral right breast 1.1 cm biopsy grade 2 IDC ER 100%, PR 100%, Ki-67 15%, HER2 equivocal by IHC, FISH negative, ratio 1.1, copy #2.75, mass left retroareolar region 0.8 cm: Tubular adenoma no malignancy identified  Pathology and radiology counseling:Discussed with the patient, the details of pathology including the type of breast cancer,the clinical staging, the significance of ER, PR and HER-2/neu receptors and the implications for treatment. After reviewing the pathology in detail, we proceeded to discuss the different treatment options between surgery, radiation, chemotherapy, antiestrogen therapies.  Recommendations: 1. Breast conserving surgery followed by 2. Oncotype DX testing to determine if chemotherapy would be of any benefit followed by 3. Adjuvant radiation therapy followed by 4. Adjuvant antiestrogen therapy  Oncotype counseling: I discussed Oncotype DX test. I explained to the patient that this is a 21 gene panel to evaluate patient tumors DNA to calculate recurrence score. This would help determine whether patient has high risk or low risk breast cancer. She understands that if her tumor was found to be high risk, she would benefit from systemic chemotherapy. If low risk, no need of chemotherapy.  Return to clinic after surgery to discuss final pathology report and then determine if Oncotype DX testing will need to be sent.    All questions were answered. The patient knows to  call the clinic with any problems, questions or concerns.   Rulon Eisenmenger, MD, MPH 02/22/2021    I, Veronica Santos, am acting as scribe for Nicholas Lose, MD.  I have reviewed the above documentation for accuracy and completeness, and I agree with the above.

## 2021-02-22 ENCOUNTER — Ambulatory Visit
Admission: RE | Admit: 2021-02-22 | Discharge: 2021-02-22 | Disposition: A | Discharge status: Home / Self Care | Payer: Managed Care, Other (non HMO) | Source: Ambulatory Visit | Attending: Radiation Oncology | Admitting: Radiation Oncology

## 2021-02-22 ENCOUNTER — Ambulatory Visit: Payer: Self-pay | Admitting: General Surgery

## 2021-02-22 ENCOUNTER — Other Ambulatory Visit: Payer: Self-pay

## 2021-02-22 ENCOUNTER — Encounter: Payer: Self-pay | Admitting: Radiation Oncology

## 2021-02-22 ENCOUNTER — Inpatient Hospital Stay: Payer: Managed Care, Other (non HMO) | Attending: Hematology and Oncology | Admitting: Hematology and Oncology

## 2021-02-22 VITALS — BP 129/82 | HR 88 | Temp 97.9°F | Resp 18 | Ht 67.0 in | Wt 156.2 lb

## 2021-02-22 DIAGNOSIS — D242 Benign neoplasm of left breast: Secondary | ICD-10-CM | POA: Insufficient documentation

## 2021-02-22 DIAGNOSIS — Z170 Estrogen receptor positive status [ER+]: Secondary | ICD-10-CM

## 2021-02-22 DIAGNOSIS — C50511 Malignant neoplasm of lower-outer quadrant of right female breast: Secondary | ICD-10-CM | POA: Insufficient documentation

## 2021-02-22 DIAGNOSIS — Z803 Family history of malignant neoplasm of breast: Secondary | ICD-10-CM | POA: Insufficient documentation

## 2021-02-22 MED ORDER — TAMOXIFEN CITRATE 20 MG PO TABS: 20.0000 mg | ORAL_TABLET | Freq: Every day | ORAL | 0 refills | Status: DC

## 2021-02-22 NOTE — Assessment & Plan Note (Signed)
01/27/2021: Breast MRI performed for high risk screening: Dominant mass inferolateral right breast 1.1 cm biopsy grade 2 IDC ER 100%, PR 100%, Ki-67 15%, HER2 equivocal by IHC, FISH negative, ratio 1.1, copy #2.75, mass left retroareolar region 0.8 cm: Tubular adenoma no malignancy identified  Pathology and radiology counseling:Discussed with the patient, the details of pathology including the type of breast cancer,the clinical staging, the significance of ER, PR and HER-2/neu receptors and the implications for treatment. After reviewing the pathology in detail, we proceeded to discuss the different treatment options between surgery, radiation, chemotherapy, antiestrogen therapies.  Recommendations: 1. Breast conserving surgery followed by 2. Oncotype DX testing to determine if chemotherapy would be of any benefit followed by 3. Adjuvant radiation therapy followed by 4. Adjuvant antiestrogen therapy  Oncotype counseling: I discussed Oncotype DX test. I explained to the patient that this is a 21 gene panel to evaluate patient tumors DNA to calculate recurrence score. This would help determine whether patient has high risk or low risk breast cancer. She understands that if her tumor was found to be high risk, she would benefit from systemic chemotherapy. If low risk, no need of chemotherapy.  Return to clinic after surgery to discuss final pathology report and then determine if Oncotype DX testing will need to be sent.

## 2021-02-23 ENCOUNTER — Other Ambulatory Visit: Payer: Self-pay | Admitting: General Surgery

## 2021-02-23 ENCOUNTER — Telehealth: Payer: Self-pay | Admitting: Hematology and Oncology

## 2021-02-23 ENCOUNTER — Encounter: Payer: Self-pay | Admitting: *Deleted

## 2021-02-23 DIAGNOSIS — C50511 Malignant neoplasm of lower-outer quadrant of right female breast: Secondary | ICD-10-CM

## 2021-02-23 DIAGNOSIS — D242 Benign neoplasm of left breast: Secondary | ICD-10-CM

## 2021-02-23 NOTE — Telephone Encounter (Signed)
Scheduled per sch msg. Called and spoke with patient. Confirmed appt  

## 2021-02-24 ENCOUNTER — Other Ambulatory Visit: Payer: Self-pay

## 2021-02-24 ENCOUNTER — Ambulatory Visit: Payer: 59 | Attending: Hematology and Oncology | Admitting: Physical Therapy

## 2021-02-24 ENCOUNTER — Encounter: Payer: Self-pay | Admitting: Physical Therapy

## 2021-02-24 DIAGNOSIS — C50511 Malignant neoplasm of lower-outer quadrant of right female breast: Secondary | ICD-10-CM | POA: Diagnosis not present

## 2021-02-24 DIAGNOSIS — C50911 Malignant neoplasm of unspecified site of right female breast: Secondary | ICD-10-CM | POA: Insufficient documentation

## 2021-02-24 DIAGNOSIS — Z170 Estrogen receptor positive status [ER+]: Secondary | ICD-10-CM | POA: Insufficient documentation

## 2021-02-24 DIAGNOSIS — R293 Abnormal posture: Secondary | ICD-10-CM | POA: Diagnosis present

## 2021-02-24 NOTE — Patient Instructions (Signed)
Physical Therapy Information for After Breast Cancer Surgery/Treatment:  Lymphedema is a swelling condition that you may be at risk for in your arm if you have lymph nodes removed from the armpit area.  After a sentinel node biopsy, the risk is approximately 5-9% and is higher after an axillary node dissection.  There is treatment available for this condition and it is not life-threatening.  Contact your physician or physical therapist with concerns. You may begin the 4 shoulder/posture exercises (see additional sheet) when permitted by your physician (typically a week after surgery).  If you have drains, you may need to wait until those are removed before beginning range of motion exercises.  A general recommendation is to not lift your arms above shoulder height until drains are removed.  These exercises should be done to your tolerance and gently.  This is not a "no pain/no gain" type of recovery so listen to your body and stretch into the range of motion that you can tolerate, stopping if you have pain.  If you are having immediate reconstruction, ask your plastic surgeon about doing exercises as he or she may want you to wait. We encourage you to attend the free one time ABC (After Breast Cancer) class offered by [facility].  You will learn information related to lymphedema risk, prevention and treatment and additional exercises to regain mobility following surgery.  You can call [phone] for more information.  This is offered the 1st and 3rd Monday of each month.  You only attend the class one time. While undergoing any medical procedure or treatment, try to avoid blood pressure being taken or needle sticks from occurring on the arm on the side of cancer.   This recommendation begins after surgery and continues for the rest of your life.  This may help reduce your risk of getting lymphedema (swelling in your arm). An excellent resource for those seeking information on  lymphedema is the National Lymphedema Network's web site. It can be accessed at [place].org If you notice swelling in your hand, arm or breast at any time following surgery (even if it is many years from now), please contact your doctor or physical therapist to discuss this.  Lymphedema can be treated at any time but it is easier for you if it is treated early on.  If you feel like your shoulder motion is not returning to normal in a reasonable amount of time, please contact your surgeon or physical therapist.  [facility]. [place], [place], [place] [phone]; [address], [place], [place] [address] ABC CLASS After Breast Cancer Class  After Breast Cancer Class is a specially designed exercise class to assist you in a safe recover after having breast cancer surgery.  In this class you will learn how to get back to full function whether your drains were just removed or if you had surgery a month ago.  This one-time class is held the 1st and 3rd Monday of every month from 11:00 a.m. until 12:00 noon at the [facility] located at [place], [place] [address]  This class is FREE and space is limited. For more information or to register for the next available class, call [phone].  Class Goals  Understand specific stretches to improve the flexibility of you chest and shoulder. Learn ways to safely strengthen your upper body and improve your posture. Understand the warning signs of infection and why you may be at risk for an arm infection.  Learn about Lymphedema and prevention.  ** You do not attend this class until after surgery.  Drains must be removed to participate  Patient was instructed today in a home exercise program today for post op shoulder range of motion. These included active assist shoulder flexion in sitting, scapular retraction, wall walking with shoulder abduction, and hands behind head external rotation.  She was encouraged to do these  twice a day, holding 3 seconds and repeating 5 times when permitted by her physician.   First of all, check with your insurance company to see if provider is in [place] (for wigs and compression sleeves / gloves/gauntlets )   [address] [place], [place] Phone: [phone]   Fax: [phone] Will file some insurances --- call for appointment   Second to [facility] (for mastectomy prosthetics and garments) [address] [place], [place]  Phone: [phone]  Fax: [phone] Will file some insurances --- call for appointment  [facility]  [address]  [place], [place] [address] [phone] Lower extremity garments  [facility] [address] [place], [place]  [address] [facility] (for compression garments, especilly for lower extremities) [address], [place], [place]  [address] [phone] Call for appointment    Jerrol Banana ,certified fitter [facility]  [phone]  [employer] (for mastectomy supplies and garments) [place] [place],  [address] [phone]  Other Resources: National Lymphedema Network:  www.lymphnet.org www.Klosetraining.com for patient articles and self manual lymph drainage information www.lymphedemablog.com has informative articles.  DishTag.es.com www.lymphedemaproducts.com www.brightlifedirect.com

## 2021-02-24 NOTE — Therapy (Signed)
[facility] [place] [address] [place], [place], [address] Phone: [phone]   Fax:  [phone]  Physical Therapy Evaluation  Patient Details  Name: Veronica Santos MRN: [id number] Date of Birth: February 24, 1971 Referring Provider (PT): Dr. Pamelia Hoit   Encounter Date: 02/24/2021   PT End of Session - 02/24/21 0955     Visit Number 1    Number of Visits 2    Date for PT Re-Evaluation 04/25/21    PT Start Time 0900    PT Stop Time 0950    PT Time Calculation (min) 50 min    Activity Tolerance Patient tolerated treatment well    Behavior During Therapy [facility] for tasks assessed/performed             History reviewed. No pertinent past medical history.  Past Surgical History:  Procedure Laterality Date   BREAST BIOPSY Left 02/08/2021   BREAST BIOPSY Right 02/08/2021    There were no vitals filed for this visit.    Subjective Assessment - 02/24/21 0954     Subjective Pt is here to learn what she needs to know before surgery. She is awaiting on genetic testing to find out for sure what her treatment will be    Pertinent History 02/08/2021 breast biopsy for left breast cancer which is ER 100%, PR 100%, Ki-67 15%, HER2 equivocal by [facility] Planned left lumpectomy with SLN 03/23/2021. Pt also with non cancerous mass on right breast that will be removed with no lymph node removal    Patient Stated Goals to get back to normal after surgery    Currently in Pain? No/denies                [facility] PT Assessment - 02/24/21 0001       Assessment   Medical Diagnosis left breast cancer    Referring Provider (PT) Dr. Pamelia Hoit    Onset Date/Surgical Date 03/23/21    Hand Dominance Left      Precautions   Precautions Other (comment)    Precaution Comments to have surgery with SLN      Restrictions   Weight Bearing Restrictions No      Balance Screen   Has the patient fallen in the past 6 months Yes    How many times? 1   got up too  quick, got dizzy has not happened again   Has the patient had a decrease in activity level because of a fear of falling?  No    Is the patient reluctant to leave their home because of a fear of falling?  No      Home Environment   Living Environment Private residence    Living Arrangements Spouse/significant other;Children   51 year old son     Prior Function   Level of Independence Independent    Vocation Part time employment    Vocation Requirements works on [employer]    Leisure tries to walk likes to do yoga      Cognition   Overall Cognitive Status Within Functional Limits for tasks assessed      Observation/Other Assessments   Observations Pt walks into clinic without device and moves without difficutly    Other Surveys  Quick Dash    Quick DASH  2.27      Sensation   Light Touch Appears Intact      Coordination   Gross Motor Movements are Fluid and Coordinated Yes      Posture/Postural Control  Posture/Postural Control Postural limitations    Postural Limitations Rounded Shoulders;Forward head;Decreased thoracic kyphosis      ROM / Strength   AROM / PROM / Strength AROM;Strength      AROM   AROM Assessment Site Shoulder    Right/Left Shoulder Right;Left    Right Shoulder Extension 60 Degrees    Right Shoulder Flexion 190 Degrees    Right Shoulder ABduction 175 Degrees    Right Shoulder Internal Rotation 40 Degrees    Right Shoulder External Rotation 90 Degrees    Left Shoulder Extension 65 Degrees    Left Shoulder Flexion 165 Degrees    Left Shoulder ABduction 180 Degrees    Left Shoulder Internal Rotation 40 Degrees    Left Shoulder External Rotation 90 Degrees      Strength   Overall Strength Within functional limits for tasks performed               LYMPHEDEMA/ONCOLOGY QUESTIONNAIRE - 02/24/21 0001       Type   Cancer Type left breast cancer      Surgeries   Lumpectomy Date 03/23/21      Right Upper Extremity Lymphedema   10 cm Proximal to  Olecranon Process 28 cm    Olecranon Process 25 cm    15 cm Proximal to Ulnar Styloid Process 24 cm    Just Proximal to Ulnar Styloid Process 15.1 cm    Across Hand at [employer] 18 cm    At [place] of 2nd Digit 5.6 cm      Left Upper Extremity Lymphedema   10 cm Proximal to Olecranon Process 28 cm    Olecranon Process 25 cm    15 cm Proximal to Ulnar Styloid Process 24.5 cm    Just Proximal to Ulnar Styloid Process 15.5 cm    Across Hand at [employer] 18 cm    At [place] of 2nd Digit 5.6 cm             L-DEX FLOWSHEETS - 02/24/21 0900       L-DEX LYMPHEDEMA SCREENING   Measurement Type Unilateral    L-DEX MEASUREMENT EXTREMITY Upper Extremity    POSITION  Standing    DOMINANT SIDE Left    At Risk Side Right    BASELINE SCORE (UNILATERAL) 5.2                  Quick Dash - 02/24/21 0001     Open a tight or new jar Mild difficulty    Do heavy household chores (wash walls, wash floors) No difficulty    Carry a shopping bag or briefcase No difficulty    Wash your back No difficulty    Use a knife to cut food No difficulty    Recreational activities in which you take some force or impact through your arm, shoulder, or hand (golf, hammering, tennis) No difficulty    During the past week, to what extent has your arm, shoulder or hand problem interfered with your normal social activities with family, friends, neighbors, or groups? Not at all    During the past week, to what extent has your arm, shoulder or hand problem limited your work or other regular daily activities Not at all    Arm, shoulder, or hand pain. None    Tingling (pins and needles) in your arm, shoulder, or hand None    Difficulty Sleeping No difficulty    DASH Score 2.27 %  Objective measurements completed on examination: See above findings.                      Breast Clinic Goals - 02/24/21 1254       Patient will be able to verbalize understanding of  pertinent lymphedema risk reduction practices relevant to her diagnosis specifically related to skin care.   Time 8    Period Weeks    Status New      Patient will be able to return demonstrate and/or verbalize understanding of the post-op home exercise program related to regaining shoulder range of motion.   Time 1    Period Days    Status Achieved      Patient will be able to verbalize understanding of the importance of attending the postoperative After Breast Cancer Class for further lymphedema risk reduction education and therapeutic exercise.   Time 8    Period Weeks    Status New                   Plan - 02/24/21 1245     Clinical Impression Statement Pt is here for preop baseline measurements and SOZO. She is anticipating surgery 03/23/2021.  She was instructed in post op exercises and shown samples and given information aobut how to get  post op compression bras she might find comfortable. All questions were answered    Personal Factors and Comorbidities Comorbidity 2    Comorbidities upcoming surgery, rounded shoulder posture    Stability/Clinical Decision Making Stable/Uncomplicated    Clinical Decision Making Low    Rehab Potential Excellent    PT Frequency Other (comment)   3 week post op visit   PT Treatment/Interventions ADLs/Self Care Home Management;Therapeutic activities;Therapeutic exercise;Orthotic Fit/Training;Patient/family education;Manual lymph drainage;Manual techniques;Passive range of motion;Compression bandaging;Taping;Scar mobilization    PT Next Visit Plan reassess , upgrade home exercises, make sure she is signed up for ABC class, recert if necessary,  Continue with 3 month SOZO follow up    Consulted and Agree with Plan of Care Patient             Patient will benefit from skilled therapeutic intervention in order to improve the following deficits and impairments:  Decreased knowledge of precautions, Decreased knowledge of use of DME,  Postural dysfunction  Visit Diagnosis: Malignant neoplasm of right breast, stage 1, estrogen receptor positive ([place]) - Plan: PT plan of care cert/re-cert  Abnormal posture - Plan: PT plan of care cert/re-cert     Problem List Patient Active Problem List   Diagnosis Date Noted   Malignant neoplasm of lower-outer quadrant of right breast of female, estrogen receptor positive ([place]) 02/21/2021   Pain in right knee 03/09/2019   Donato Heinz. Owens Shark PT  Norwood Levo, PT 02/24/2021, 12:57 PM  [facility] [place], [place], [address] Phone: [phone]   Fax:  [phone]  Name: Veronica Santos MRN: [id number] Date of Birth: 05-Feb-1971

## 2021-02-27 ENCOUNTER — Telehealth: Payer: Self-pay | Admitting: Radiation Oncology

## 2021-02-27 NOTE — Telephone Encounter (Signed)
LVM to schedule FUN with Dr. Isidore Moos.

## 2021-03-09 NOTE — Pre-Procedure Instructions (Signed)
Surgical Instructions    Your procedure is scheduled on Thursday 03/23/21.   Report to [facility] Main Entrance "A" at 06:30 A.M., then check in with the Admitting office.  Call this number if you have problems the morning of surgery:  [phone]   If you have any questions prior to your surgery date call [phone]: Open Monday-Friday 8am-4pm    Remember:  Do not eat after midnight the night before your surgery  You may drink clear liquids until 05:30 A.M. the morning of your surgery.   Clear liquids allowed are: Water, Non-Citrus Juices (without pulp), Carbonated Beverages, Clear Tea, Black Coffee ONLY (NO MILK, CREAM OR POWDERED CREAMER of any kind), and Gatorade    Take these medicines the morning of surgery with A SIP OF WATER   tamoxifen (NOLVADEX)  As of today, STOP taking any Aspirin (unless otherwise instructed by your surgeon) Aleve, Naproxen, Ibuprofen, Motrin, Advil, Goody's, BC's, all herbal medications, fish oil, and all vitamins.     After your COVID test   You are not required to quarantine however you are required to wear a well-fitting mask when you are out and around people not in your household.  If your mask becomes wet or soiled, replace with a new one.  Wash your hands often with soap and water for 20 seconds or clean your hands with an alcohol-based hand sanitizer that contains at least 60% alcohol.  Do not share personal items.  Notify your provider: if you are in close contact with someone who has COVID  or if you develop a fever of 100.4 or greater, sneezing, cough, sore throat, shortness of breath or body aches.             Do not wear jewelry or makeup Do not wear lotions, powders, perfumes/colognes, or deodorant. Do not shave 48 hours prior to surgery.  Men may shave face and neck. Do not bring valuables to the hospital. DO Not wear nail polish, gel polish, artificial nails, or any other type of covering on natural nails including finger  and toenails. If patients have artificial nails, gel coating, etc. that need to be removed by a nail salon, please have this removed prior to surgery or surgery may need to be canceled/delayed if the surgeon/ anesthesia feels like the patient is unable to be adequately monitored.             [facility] is not responsible for any belongings or valuables.  Do NOT Smoke (Tobacco/Vaping)  24 hours prior to your procedure  If you use a CPAP at night, you may bring your mask for your overnight stay.   Contacts, glasses, hearing aids, dentures or partials may not be worn into surgery, please bring cases for these belongings   For patients admitted to the hospital, discharge time will be determined by your treatment team.   Patients discharged the day of surgery will not be allowed to drive home, and someone needs to stay with them for 24 hours.  NO VISITORS WILL BE ALLOWED IN PRE-OP WHERE PATIENTS ARE PREPPED FOR SURGERY.  ONLY 1 SUPPORT PERSON MAY BE PRESENT IN THE WAITING ROOM WHILE YOU ARE IN SURGERY.  IF YOU ARE TO BE ADMITTED, ONCE YOU ARE IN YOUR ROOM YOU WILL BE ALLOWED TWO (2) VISITORS. 1 (ONE) VISITOR MAY STAY OVERNIGHT BUT MUST ARRIVE TO THE ROOM BY 8pm.  Minor children may have two parents present. Special consideration for safety and communication needs will be reviewed on  a case by case basis.  Special instructions:    Oral Hygiene is also important to reduce your risk of infection.  Remember - BRUSH YOUR TEETH THE MORNING OF SURGERY WITH YOUR REGULAR TOOTHPASTE   [place]- Preparing For Surgery  Before surgery, you can play an important role. Because skin is not sterile, your skin needs to be as free of germs as possible. You can reduce the number of germs on your skin by washing with CHG (chlorahexidine gluconate) Soap before surgery.  CHG is an antiseptic cleaner which kills germs and bonds with the skin to continue killing germs even after washing.     Please do not use if  you have an allergy to CHG or antibacterial soaps. If your skin becomes reddened/irritated stop using the CHG.  Do not shave (including legs and underarms) for at least 48 hours prior to first CHG shower. It is OK to shave your face.  Please follow these instructions carefully.     Shower the NIGHT BEFORE SURGERY and the MORNING OF SURGERY with CHG Soap.   If you chose to wash your hair, wash your hair first as usual with your normal shampoo. After you shampoo, rinse your hair and body thoroughly to remove the shampoo.  Then [employer] and genitals (private parts) with your normal soap and rinse thoroughly to remove soap.  After that Use CHG Soap as you would any other liquid soap. You can apply CHG directly to the skin and wash gently with a scrungie or a clean washcloth.   Apply the CHG Soap to your body ONLY FROM THE NECK DOWN.  Do not use on open wounds or open sores. Avoid contact with your eyes, ears, mouth and genitals (private parts). Wash Face and genitals (private parts)  with your normal soap.   Wash thoroughly, paying special attention to the area where your surgery will be performed.  Thoroughly rinse your body with warm water from the neck down.  DO NOT shower/wash with your normal soap after using and rinsing off the CHG Soap.  Pat yourself dry with a CLEAN TOWEL.  Wear CLEAN PAJAMAS to bed the night before surgery  Place CLEAN SHEETS on your bed the night before your surgery  DO NOT SLEEP WITH PETS.   Day of Surgery:  Take a shower with CHG soap. Wear Clean/Comfortable clothing the morning of surgery Do not apply any deodorants/lotions.   Remember to brush your teeth WITH YOUR REGULAR TOOTHPASTE.   Please read over the following fact sheets that you were given.

## 2021-03-10 ENCOUNTER — Encounter (HOSPITAL_COMMUNITY)
Admission: RE | Admit: 2021-03-10 | Discharge: 2021-03-10 | Disposition: A | Discharge status: Home / Self Care | Payer: 59 | Source: Ambulatory Visit | Attending: General Surgery | Admitting: General Surgery

## 2021-03-10 ENCOUNTER — Encounter (HOSPITAL_COMMUNITY): Payer: Self-pay

## 2021-03-10 ENCOUNTER — Other Ambulatory Visit: Payer: Self-pay

## 2021-03-10 VITALS — BP 103/71 | HR 98 | Resp 17 | Ht 67.0 in | Wt 153.8 lb

## 2021-03-10 DIAGNOSIS — C50912 Malignant neoplasm of unspecified site of left female breast: Secondary | ICD-10-CM | POA: Insufficient documentation

## 2021-03-10 DIAGNOSIS — Z01812 Encounter for preprocedural laboratory examination: Secondary | ICD-10-CM | POA: Insufficient documentation

## 2021-03-10 DIAGNOSIS — C50911 Malignant neoplasm of unspecified site of right female breast: Secondary | ICD-10-CM | POA: Insufficient documentation

## 2021-03-10 DIAGNOSIS — Z01818 Encounter for other preprocedural examination: Secondary | ICD-10-CM

## 2021-03-10 HISTORY — DX: Malignant (primary) neoplasm, unspecified: C80.1

## 2021-03-10 HISTORY — DX: Pneumonia, unspecified organism: J18.9

## 2021-03-10 LAB — CBC
HCT: 40.1 % (ref 36.0–46.0)
Hemoglobin: 13.6 g/dL (ref 12.0–15.0)
MCH: 31.5 pg (ref 26.0–34.0)
MCHC: 33.9 g/dL (ref 30.0–36.0)
MCV: 92.8 fL (ref 80.0–100.0)
Platelets: 293 10*3/uL (ref 150–400)
RBC: 4.32 MIL/uL (ref 3.87–5.11)
RDW: 12 % (ref 11.5–15.5)
WBC: 6.4 10*3/uL (ref 4.0–10.5)
nRBC: 0 % (ref 0.0–0.2)

## 2021-03-10 NOTE — Progress Notes (Signed)
PCP - Dr. Gaynelle Arabian Cardiologist - Denies Oncologist: Dr. Nicholas Lose GI: Dr. Ronnette Juniper  PPM/ICD - Denies  Chest x-ray - N/A EKG - N/A Stress Test - Denies ECHO - 10/06/18 Cardiac Cath - Denies  Sleep Study - Denies  Diabetic: Denies  Blood Thinner Instructions: N/A Aspirin Instructions: N/A  ERAS Protcol - Yes PRE-SURGERY Ensure or G2- No  COVID TEST- N/A  Patient scheduled to come to PAT for urine preg test on 03/21/21 before seed placement. Rolla Flatten is aware.   Anesthesia review: Yes, seed placement  Patient denies shortness of breath, fever, cough and chest pain at PAT appointment   All instructions explained to the patient, with a verbal understanding of the material. Patient agrees to go over the instructions while at home for a better understanding. Patient also instructed to self quarantine after being tested for COVID-19. The opportunity to ask questions was provided.

## 2021-03-13 ENCOUNTER — Other Ambulatory Visit: Payer: Self-pay

## 2021-03-13 ENCOUNTER — Encounter: Payer: Self-pay | Admitting: Licensed Clinical Social Worker

## 2021-03-13 ENCOUNTER — Inpatient Hospital Stay: Payer: 59

## 2021-03-13 ENCOUNTER — Inpatient Hospital Stay: Payer: 59 | Attending: Hematology and Oncology | Admitting: Licensed Clinical Social Worker

## 2021-03-13 ENCOUNTER — Other Ambulatory Visit: Payer: Self-pay | Admitting: Licensed Clinical Social Worker

## 2021-03-13 DIAGNOSIS — C50511 Malignant neoplasm of lower-outer quadrant of right female breast: Secondary | ICD-10-CM

## 2021-03-13 DIAGNOSIS — Z8042 Family history of malignant neoplasm of prostate: Secondary | ICD-10-CM | POA: Diagnosis not present

## 2021-03-13 DIAGNOSIS — Z170 Estrogen receptor positive status [ER+]: Secondary | ICD-10-CM

## 2021-03-13 DIAGNOSIS — Z800 Family history of malignant neoplasm of digestive organs: Secondary | ICD-10-CM

## 2021-03-13 DIAGNOSIS — Z803 Family history of malignant neoplasm of breast: Secondary | ICD-10-CM

## 2021-03-13 LAB — GENETIC SCREENING ORDER

## 2021-03-13 NOTE — Progress Notes (Signed)
Anesthesia Chart Review:  Case: [id number] Date/Time: 03/23/21 0815   Procedures:      RIGHT BREAST LUMPECTOMY WITH RADIOACTIVE SEED AND SENTINEL LYMPH NODE BIOPSY (Right: Breast)     LEFT BREAST LUMPECTOMY WITH RADIOACTIVE SEED LOCALIZATION (Left: Breast)   Anesthesia type: General   Pre-op diagnosis: RIGHT BREAST CANCER, LEFT BREAST ADENOMA   Location: [facility] OR ROOM 09 / [place] OR   Surgeons: Jovita Kussmaul, MD     Special needs include: ERAS, supine, Magtrace injection, Neoprobe, PEC block  DISCUSSION: Patient is a 50 year old female scheduled for the above procedure.  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 Event monitor for palpitations in 2019 showed occasional PVCs. 2020 Echo showed normal LVEF, normal RVSF, mild AI, no atrial level shunt.  RSL placement 03/21/21 at 1:00 PM. LMP 03/09/21. She is scheduled for a urine pregnancy test ~ 11:00 AM on 03/21/21--she will come by [facility] PAT to provide urine sample before going to the [facility] for radioactive seed implant.    Anesthesia team to evaluate on the day of surgery.   VS: BP 103/71    Pulse 98    Resp 17    Ht 5\' 7"  (1.702 m)    Wt 69.8 kg    LMP 03/09/2021    SpO2 98%    BMI 24.09 kg/m    PROVIDERS: Gaynelle Arabian, MD is PCP  Nicholas Lose, MD is HEM-ONC Eppie Gibson, MD is RAD-ONC Ronnette Juniper, MD is GI    LABS: Labs reviewed: Acceptable for surgery. (all labs ordered are listed, but only abnormal results are displayed)  Labs Reviewed  CBC    EKG: N/A   CV: Echo 10/06/18: IMPRESSIONS   1. The left ventricle has normal systolic function, with an ejection  fraction of 55-60%. The cavity size was normal. Left ventricular diastolic  parameters were normal.   2. The right ventricle has normal systolic function. The cavity was  normal. There is no increase in  right ventricular wall thickness.   3. Aortic valve regurgitation is mild by color flow Doppler.    Cardiac event monitor 08/28/17-09/08/17: Sinus rhythm Occasional premature ventricular contractions No sustained arrhythmias No afib No AV block or pauses Symptomatic transmissions for "palpitations" correspond to PVCs Symptomatic transmissions for "lightheadedness/ dizziness" correspond to sinus rhythm and rarely to PVCs   Past Medical History:  Diagnosis Date   Cancer [facility])    Family history of breast cancer    Family history of colon cancer    Family history of prostate cancer    Pneumonia     Past Surgical History:  Procedure Laterality Date   BREAST BIOPSY Left 02/08/2021   BREAST BIOPSY Right 02/08/2021   CESAREAN SECTION     X2   CHOLECYSTECTOMY     TONSILLECTOMY      MEDICATIONS:  metroNIDAZOLE (METROCREAM) 0.75 % cream   Multiple Vitamins-Minerals (MULTIVITAMIN WITH MINERALS) tablet   tamoxifen (NOLVADEX) 20 MG tablet   No current facility-administered medications for this encounter.  As of 03/09/2021, she had not yet started tamoxifen.   Myra Gianotti, PA-C Surgical Short Stay/Anesthesiology [facility] Phone [phone] [facility] Phone [phone] 03/13/2021 11:35 AM

## 2021-03-13 NOTE — Anesthesia Preprocedure Evaluation (Addendum)
Anesthesia Evaluation  °Patient identified by MRN, date of birth, ID band °Patient awake ° ° ° °Reviewed: °Allergy & Precautions, NPO status , Patient's Chart, lab work & pertinent test results ° °Airway °Mallampati: II ° °TM Distance: >3 FB °Neck ROM: Full ° ° ° Dental ° °(+) Chipped,  °  °Pulmonary °neg pulmonary ROS,  °  °Pulmonary exam normal ° ° ° ° ° ° ° Cardiovascular °negative cardio ROS ° ° °Rhythm:Regular Rate:Normal ° ° °  °Neuro/Psych °negative neurological ROS ° negative psych ROS  ° GI/Hepatic °negative GI ROS, Neg liver ROS,   °Endo/Other  °negative endocrine ROS ° Renal/GU °negative Renal ROS  °negative genitourinary °  °Musculoskeletal °Breast Ca  ° Abdominal °Normal abdominal exam  (+)   °Peds ° Hematology °negative hematology ROS °(+)   °Anesthesia Other Findings ° ° Reproductive/Obstetrics ° °  ° ° ° ° ° ° ° ° ° ° ° ° ° °  °  ° ° ° ° ° ° ° °Anesthesia Physical °Anesthesia Plan ° °ASA: 3 ° °Anesthesia Plan: General and Regional  ° °Post-op Pain Management: Regional block and Tylenol PO (pre-op)  ° °Induction: Intravenous ° °PONV Risk Score and Plan: 2 and Ondansetron, Dexamethasone, Midazolam and Treatment may vary due to age or medical condition ° °Airway Management Planned: Mask and LMA ° °Additional Equipment: None ° °Intra-op Plan:  ° °Post-operative Plan: Extubation in OR ° °Informed Consent: I have reviewed the patients History and Physical, chart, labs and discussed the procedure including the risks, benefits and alternatives for the proposed anesthesia with the patient or authorized representative who has indicated his/her understanding and acceptance.  ° ° ° °Dental advisory given ° °Plan Discussed with: CRNA ° °Anesthesia Plan Comments: (PAT note written 03/13/2021 by Allison Zelenak, PA-C. ° °Lab Results °     Component                Value               Date                 °     WBC                      6.4                 03/10/2021           °      HGB                      13.6                03/10/2021           °     HCT                      40.1                03/10/2021           °     MCV                      92.8                03/10/2021           °     PLT                        03/10/2021                PLT                      293                 03/10/2021           No results found for: NA, K, CO2, GLUCOSE, BUN, CREATININE, CALCIUM, EGFR, GFRNONAA, GLUCOSE Echo 10/06/18: IMPRESSIONS  1. The left ventricle has normal systolic function, with an ejection  fraction of 55-60%. The cavity size was normal. Left ventricular diastolic  parameters were normal.  2. The right ventricle has normal systolic function. The cavity was  normal. There is no increase in right ventricular wall thickness.  3. Aortic valve regurgitation is mild by color flow Doppler.  )    Anesthesia Quick Evaluation

## 2021-03-13 NOTE — Progress Notes (Signed)
REFERRING PROVIDER: Nicholas Lose, MD [place],  [place] [address]  PRIMARY PROVIDER:  Gaynelle Arabian, MD  PRIMARY REASON FOR VISIT:  1. Malignant neoplasm of lower-outer quadrant of right breast of female, estrogen receptor positive ([place])   2. Family history of breast cancer   3. Family history of colon cancer   4. Family history of prostate cancer      HISTORY OF PRESENT ILLNESS:   Ms. Nish, a 50 y.o. female, was seen for a [facility] cancer genetics consultation at the request of Dr. Lindi Adie due to a personal and family history of cancer.  Ms. Wolanski presents to clinic today to discuss the possibility of a hereditary predisposition to cancer, genetic testing, and to further clarify her future cancer risks, as well as potential cancer risks for family members.   In 2022, at the age of 26, Ms. Clonch was diagnosed with invasive ductal carcinoma w/ DCIS of the right breast, ER/PR+ /HER2-. The treatment plan includes surgery which is scheduled for 03/23/2021, Oncotype, adjuvant radiation and adjuvant antiestrogen therapy.  Ms. Hoot would use genetic test results to help with surgical decision if they are available in time.  CANCER HISTORY:  Oncology History  Malignant neoplasm of lower-outer quadrant of right breast of female, estrogen receptor positive ([place])  02/21/2021 Initial Diagnosis   MRI Breast: a dominant mass in the inferolateral right breast, and a mass in the left retroareolar breast. Biopsy: invasive mammary carcinoma and mammary carcinoma in situ with calcifications in the right breast, and no malignancy in the left breast.    02/22/2021 Cancer Staging   Staging form: Breast, AJCC 8th Edition - Clinical stage from 02/22/2021: Stage IA (cT1c, cN0, cM0, G2, ER+, PR+, HER2-) - Signed by Nicholas Lose, MD on 02/22/2021 Stage prefix: Initial diagnosis Histologic grading system: 3 grade system       RISK FACTORS:  Menarche was at age 94-13.   First live birth at age 64.  OCP use for approximately  18  years.  Ovaries intact: yes.  Hysterectomy: no.  Menopausal status: premenopausal.  HRT use: 0 years. Colonoscopy: yes;  3 polyps at 64, 3 more at 69 . Mammogram within the last year: yes. Number of breast biopsies: 2. Up to date with pelvic exams: yes. Any excessive radiation exposure in the past: no  Past Medical History:  Diagnosis Date   Cancer ([place])    Family history of breast cancer    Family history of colon cancer    Family history of prostate cancer    Pneumonia     Past Surgical History:  Procedure Laterality Date   BREAST BIOPSY Left 02/08/2021   BREAST BIOPSY Right 02/08/2021   CESAREAN SECTION     X2   CHOLECYSTECTOMY     TONSILLECTOMY      Social History   Socioeconomic History   Marital status: Married    Spouse name: Not on file   Number of children: Not on file   Years of education: Not on file   Highest education level: Not on file  Occupational History   Not on file  Tobacco Use   Smoking status: Never   Smokeless tobacco: Never  Vaping Use   Vaping Use: Never used  Substance and Sexual Activity   Alcohol use: Yes    Comment: socially   Drug use: Never   Sexual activity: Yes  Other Topics Concern   Not on file  Social History Narrative   Not on  file   Social Determinants of Health   Financial Resource Strain: Not on file  Food Insecurity: Not on file  Transportation Needs: Not on file  Physical Activity: Not on file  Stress: Not on file  Social Connections: Not on file     FAMILY HISTORY:  We obtained a detailed, 4-generation family history.  Significant diagnoses are listed below: Family History  Problem Relation Age of Onset   Colon cancer Father 14   Prostate cancer Paternal Uncle 86   Breast cancer Paternal Grandmother        dx 54s   Ms. Panjwani has 2 sons (80 and 4) and 1 daughter (23), no cancers. She has 1 brother (36) no cancers.   Ms. Pickney  mother is living at 62 with no history of cancer. Patient has 1 maternal aunt, 1 maternal uncle, and 4 maternal cousins, no cancers. Maternal grandmother died at 59, grandfather died in his 29s.  Ms. Batte father was diagnosed with colon cancer at 53 and is living at 11. Patient has 1 paternal uncle who had prostate cancer recently at 46. No paternal first cousins. Paternal grandmother had breast cancer at 67 and died in her 12s and she had a sister who had lung cancer with no history of smoking. Paternal grandfather died of a stroke in his late 7s.  Ms. Hoel is unaware of previous family history of genetic testing for hereditary cancer risks. Patient's maternal ancestors are of English/Irish descent, and paternal ancestors are of English/Irish descent. There is no reported Ashkenazi Jewish ancestry. There is no known consanguinity.    GENETIC COUNSELING ASSESSMENT: Ms. Bousquet is a 50 y.o. female with a personal and family history of cancer which is somewhat suggestive of a hereditary cancer syndrome and predisposition to cancer. We, therefore, discussed and recommended the following at today's visit.   DISCUSSION: We discussed that approximately 10% of breast cancer is hereditary. Most cases of hereditary breast cancer are associated with BRCA1/BRCA2 genes, although there are other genes associated with hereditary cancer as well. Cancers and risks are gene specific.  We discussed that testing is beneficial for several reasons including surgical decision-making for breast cancer, knowing about other cancer risks, identifying potential screening and risk-reduction options that may be appropriate, and to understand if other family members could be at risk for cancer and allow them to undergo genetic testing.   We reviewed the characteristics, features and inheritance patterns of hereditary cancer syndromes. We also discussed genetic testing, including the appropriate family members to test, the  process of testing, insurance coverage and turn-around-time for results. We discussed the implications of a negative, positive and/or variant of uncertain significant result. In order to get genetic test results in a timely manner so that Ms. Lawhorn can use these genetic test results for surgical decisions, we recommended Ms. Henshaw pursue genetic testing for the [facility] panel. Once complete, we recommend Ms. Augustine pursue reflex genetic testing to the CustomNext-47 gene panel.   Based on Ms. Purrington personal and family history of cancer, she meets medical criteria for genetic testing. Despite that she meets criteria, she may still have an out of pocket cost.   PLAN: After considering the risks, benefits, and limitations, Ms. Salazar provided informed consent to pursue genetic testing and the blood sample was sent to [facility] for analysis of the BRCAPlus+CustomNext+RNA panel. Results should be available within approximately 5-12 days' time, at which point they will be disclosed by telephone to Ms. Zuk, as will  any additional recommendations warranted by these results. Ms. Flanigan will receive a summary of her genetic counseling visit and a copy of her results once available. This information will also be available in Epic.   Ms. Gramajo questions were answered to her satisfaction today. Our contact information was provided should additional questions or concerns arise. Thank you for the referral and allowing [place] to share in the care of your patient.   Faith Rogue, MS, [facility] Genetic Counselor [email or web address] Phone: [phone]  The patient was seen for a total of 25 minutes in face-to-face genetic counseling.  Patient was seen alone. Dr. Grayland Ormond was available for discussion regarding this case.   _______________________________________________________________________ For Office Staff:  Number of people involved in session: 1 Was an Intern/ student  involved with case: no

## 2021-03-21 ENCOUNTER — Encounter (HOSPITAL_COMMUNITY): Payer: Self-pay | Admitting: Vascular Surgery

## 2021-03-21 ENCOUNTER — Telehealth: Payer: Self-pay | Admitting: Genetic Counselor

## 2021-03-21 ENCOUNTER — Ambulatory Visit
Admission: RE | Admit: 2021-03-21 | Discharge: 2021-03-21 | Disposition: A | Discharge status: Home / Self Care | Payer: PRIVATE HEALTH INSURANCE | Source: Ambulatory Visit | Attending: General Surgery | Admitting: General Surgery

## 2021-03-21 ENCOUNTER — Ambulatory Visit
Admission: RE | Admit: 2021-03-21 | Discharge: 2021-03-21 | Disposition: A | Discharge status: Home / Self Care | Payer: Managed Care, Other (non HMO) | Source: Ambulatory Visit | Attending: General Surgery | Admitting: General Surgery

## 2021-03-21 ENCOUNTER — Other Ambulatory Visit (HOSPITAL_COMMUNITY)
Admission: RE | Admit: 2021-03-21 | Discharge: 2021-03-21 | Disposition: A | Discharge status: Home / Self Care | Payer: 59 | Source: Ambulatory Visit | Attending: General Surgery | Admitting: General Surgery

## 2021-03-21 DIAGNOSIS — Z01812 Encounter for preprocedural laboratory examination: Secondary | ICD-10-CM | POA: Insufficient documentation

## 2021-03-21 DIAGNOSIS — D242 Benign neoplasm of left breast: Secondary | ICD-10-CM

## 2021-03-21 DIAGNOSIS — C50511 Malignant neoplasm of lower-outer quadrant of right female breast: Secondary | ICD-10-CM

## 2021-03-21 LAB — POCT PREGNANCY, URINE: Preg Test, Ur: NEGATIVE

## 2021-03-21 NOTE — Telephone Encounter (Signed)
Negative genetic testing on the BRCAPlus panel.  Discussed that this is only a portion of the test that was ordered, and the genes on it play a role in surgical decisions. The remainder of the results will be back in a week or two and someone will call with those when they are complete.

## 2021-03-21 NOTE — Progress Notes (Signed)
Patient arrived for urine POCT lab appointment.  Urine collected and urine POCT is negative.

## 2021-03-23 ENCOUNTER — Ambulatory Visit
Admission: RE | Admit: 2021-03-23 | Discharge: 2021-03-23 | Disposition: A | Discharge status: Home / Self Care | Payer: Managed Care, Other (non HMO) | Source: Ambulatory Visit | Attending: General Surgery | Admitting: General Surgery

## 2021-03-23 ENCOUNTER — Ambulatory Visit (HOSPITAL_COMMUNITY): Payer: 59 | Admitting: Vascular Surgery

## 2021-03-23 ENCOUNTER — Other Ambulatory Visit: Payer: Self-pay

## 2021-03-23 ENCOUNTER — Encounter (HOSPITAL_COMMUNITY): Payer: Self-pay | Admitting: General Surgery

## 2021-03-23 ENCOUNTER — Ambulatory Visit (HOSPITAL_COMMUNITY)
Admission: RE | Admit: 2021-03-23 | Discharge: 2021-03-23 | Disposition: A | Discharge status: Home / Self Care | Payer: 59 | Source: Ambulatory Visit | Attending: General Surgery | Admitting: General Surgery

## 2021-03-23 ENCOUNTER — Ambulatory Visit (HOSPITAL_COMMUNITY): Payer: 59 | Admitting: Anesthesiology

## 2021-03-23 ENCOUNTER — Encounter (HOSPITAL_COMMUNITY)
Admission: RE | Disposition: A | Discharge status: Home / Self Care | Payer: Self-pay | Source: Ambulatory Visit | Attending: General Surgery

## 2021-03-23 DIAGNOSIS — D242 Benign neoplasm of left breast: Secondary | ICD-10-CM | POA: Diagnosis not present

## 2021-03-23 DIAGNOSIS — C50511 Malignant neoplasm of lower-outer quadrant of right female breast: Secondary | ICD-10-CM

## 2021-03-23 DIAGNOSIS — Z170 Estrogen receptor positive status [ER+]: Secondary | ICD-10-CM | POA: Diagnosis not present

## 2021-03-23 HISTORY — PX: BREAST LUMPECTOMY WITH RADIOACTIVE SEED LOCALIZATION: SHX6424

## 2021-03-23 HISTORY — PX: AXILLARY SENTINEL NODE BIOPSY: SHX5738

## 2021-03-23 HISTORY — PX: BREAST LUMPECTOMY WITH RADIOACTIVE SEED AND SENTINEL LYMPH NODE BIOPSY: SHX6550

## 2021-03-23 SURGERY — BREAST LUMPECTOMY WITH RADIOACTIVE SEED AND SENTINEL LYMPH NODE BIOPSY
Anesthesia: Regional | Site: Breast | Laterality: Right

## 2021-03-23 MED ORDER — PROPOFOL 10 MG/ML IV BOLUS
INTRAVENOUS | Status: AC
  Filled 2021-03-23: qty 20

## 2021-03-23 MED ORDER — EPHEDRINE 5 MG/ML INJ
INTRAVENOUS | Status: AC
  Filled 2021-03-23: qty 5

## 2021-03-23 MED ORDER — FENTANYL CITRATE (PF) 100 MCG/2ML IJ SOLN
INTRAMUSCULAR | Status: DC | PRN
  Administered 2021-03-23: 50 ug via INTRAVENOUS
  Administered 2021-03-23 (×2): 100 ug via INTRAVENOUS

## 2021-03-23 MED ORDER — FENTANYL CITRATE (PF) 250 MCG/5ML IJ SOLN
INTRAMUSCULAR | Status: AC
  Filled 2021-03-23: qty 5

## 2021-03-23 MED ORDER — CHLORHEXIDINE GLUCONATE 0.12 % MT SOLN
15.0000 mL | Freq: Once | OROMUCOSAL | Status: AC
  Administered 2021-03-23: 07:00:00 15 mL via OROMUCOSAL
  Filled 2021-03-23: qty 15

## 2021-03-23 MED ORDER — ONDANSETRON HCL 4 MG/2ML IJ SOLN
INTRAMUSCULAR | Status: AC
  Filled 2021-03-23: qty 2

## 2021-03-23 MED ORDER — 0.9 % SODIUM CHLORIDE (POUR BTL) OPTIME
TOPICAL | Status: DC | PRN
  Administered 2021-03-23: 10:00:00 1000 mL

## 2021-03-23 MED ORDER — ACETAMINOPHEN 500 MG PO TABS
1000.0000 mg | ORAL_TABLET | ORAL | Status: AC
  Administered 2021-03-23: 07:00:00 1000 mg via ORAL

## 2021-03-23 MED ORDER — BUPIVACAINE-EPINEPHRINE (PF) 0.25% -1:200000 IJ SOLN
INTRAMUSCULAR | Status: AC
  Filled 2021-03-23: qty 30

## 2021-03-23 MED ORDER — LIDOCAINE 2% (20 MG/ML) 5 ML SYRINGE
INTRAMUSCULAR | Status: AC
  Filled 2021-03-23: qty 5

## 2021-03-23 MED ORDER — VANCOMYCIN HCL IN DEXTROSE 1-5 GM/200ML-% IV SOLN
1000.0000 mg | INTRAVENOUS | Status: AC
  Administered 2021-03-23: 08:00:00 1000 mg via INTRAVENOUS
  Filled 2021-03-23: qty 200

## 2021-03-23 MED ORDER — ROCURONIUM BROMIDE 10 MG/ML (PF) SYRINGE
PREFILLED_SYRINGE | INTRAVENOUS | Status: AC
  Filled 2021-03-23: qty 10

## 2021-03-23 MED ORDER — ONDANSETRON HCL 4 MG/2ML IJ SOLN
INTRAMUSCULAR | Status: DC | PRN
  Administered 2021-03-23: 4 mg via INTRAVENOUS

## 2021-03-23 MED ORDER — FENTANYL CITRATE (PF) 100 MCG/2ML IJ SOLN
25.0000 ug | INTRAMUSCULAR | Status: DC | PRN
  Administered 2021-03-23: 11:00:00 25 ug via INTRAVENOUS

## 2021-03-23 MED ORDER — FENTANYL CITRATE (PF) 100 MCG/2ML IJ SOLN
INTRAMUSCULAR | Status: AC
  Filled 2021-03-23: qty 2

## 2021-03-23 MED ORDER — LACTATED RINGERS IV SOLN: INTRAVENOUS | Status: DC

## 2021-03-23 MED ORDER — PROPOFOL 10 MG/ML IV BOLUS
INTRAVENOUS | Status: DC | PRN
  Administered 2021-03-23: 120 mg via INTRAVENOUS
  Administered 2021-03-23: 20 mg via INTRAVENOUS

## 2021-03-23 MED ORDER — ACETAMINOPHEN 500 MG PO TABS
1000.0000 mg | ORAL_TABLET | Freq: Once | ORAL | Status: DC
  Filled 2021-03-23: qty 2

## 2021-03-23 MED ORDER — PHENYLEPHRINE HCL (PRESSORS) 10 MG/ML IV SOLN
INTRAVENOUS | Status: DC | PRN
  Administered 2021-03-23 (×2): 120 ug via INTRAVENOUS
  Administered 2021-03-23 (×2): 80 ug via INTRAVENOUS

## 2021-03-23 MED ORDER — MIDAZOLAM HCL 5 MG/5ML IJ SOLN
INTRAMUSCULAR | Status: DC | PRN
  Administered 2021-03-23: 2 mg via INTRAVENOUS

## 2021-03-23 MED ORDER — SCOPOLAMINE 1 MG/3DAYS TD PT72
1.0000 | MEDICATED_PATCH | TRANSDERMAL | Status: DC
  Administered 2021-03-23: 07:00:00 1.5 mg via TRANSDERMAL
  Filled 2021-03-23: qty 1

## 2021-03-23 MED ORDER — BUPIVACAINE-EPINEPHRINE 0.25% -1:200000 IJ SOLN
INTRAMUSCULAR | Status: DC | PRN
  Administered 2021-03-23: 20 mL

## 2021-03-23 MED ORDER — EPHEDRINE SULFATE-NACL 50-0.9 MG/10ML-% IV SOSY
PREFILLED_SYRINGE | INTRAVENOUS | Status: DC | PRN
  Administered 2021-03-23 (×2): 12.5 mg via INTRAVENOUS

## 2021-03-23 MED ORDER — ROPIVACAINE HCL 5 MG/ML IJ SOLN
INTRAMUSCULAR | Status: DC | PRN
  Administered 2021-03-23: 25 mL

## 2021-03-23 MED ORDER — LIDOCAINE 2% (20 MG/ML) 5 ML SYRINGE
INTRAMUSCULAR | Status: DC | PRN
  Administered 2021-03-23: 40 mg via INTRAVENOUS

## 2021-03-23 MED ORDER — PROMETHAZINE HCL 25 MG/ML IJ SOLN: 6.2500 mg | INTRAMUSCULAR | Status: DC | PRN

## 2021-03-23 MED ORDER — PHENYLEPHRINE 40 MCG/ML (10ML) SYRINGE FOR IV PUSH (FOR BLOOD PRESSURE SUPPORT)
PREFILLED_SYRINGE | INTRAVENOUS | Status: AC
  Filled 2021-03-23: qty 10

## 2021-03-23 MED ORDER — MIDAZOLAM HCL 2 MG/2ML IJ SOLN
INTRAMUSCULAR | Status: AC
  Filled 2021-03-23: qty 2

## 2021-03-23 MED ORDER — GABAPENTIN 300 MG PO CAPS
300.0000 mg | ORAL_CAPSULE | ORAL | Status: AC
  Administered 2021-03-23: 07:00:00 300 mg via ORAL
  Filled 2021-03-23: qty 1

## 2021-03-23 MED ORDER — MAGTRACE LYMPHATIC TRACER
INTRAMUSCULAR | Status: DC | PRN
  Administered 2021-03-23: 09:00:00 3 mL via INTRAMUSCULAR

## 2021-03-23 MED ORDER — ORAL CARE MOUTH RINSE: 15.0000 mL | Freq: Once | OROMUCOSAL | Status: AC

## 2021-03-23 MED ORDER — DEXAMETHASONE SODIUM PHOSPHATE 10 MG/ML IJ SOLN
INTRAMUSCULAR | Status: DC | PRN
  Administered 2021-03-23: 4 mg via INTRAVENOUS

## 2021-03-23 MED ORDER — HYDROCODONE-ACETAMINOPHEN 5-325 MG PO TABS: 1.0000 | ORAL_TABLET | ORAL | 0 refills | Status: DC | PRN

## 2021-03-23 MED ORDER — CLONIDINE HCL (ANALGESIA) 100 MCG/ML EP SOLN
EPIDURAL | Status: DC | PRN
  Administered 2021-03-23: 100 ug

## 2021-03-23 MED ORDER — LACTATED RINGERS IV SOLN: INTRAVENOUS | Status: DC | PRN

## 2021-03-23 MED ORDER — DEXAMETHASONE SODIUM PHOSPHATE 10 MG/ML IJ SOLN
INTRAMUSCULAR | Status: AC
  Filled 2021-03-23: qty 1

## 2021-03-23 MED ORDER — CHLORHEXIDINE GLUCONATE CLOTH 2 % EX PADS: 6.0000 | MEDICATED_PAD | Freq: Once | CUTANEOUS | Status: DC

## 2021-03-23 MED ORDER — ROCURONIUM BROMIDE 10 MG/ML (PF) SYRINGE
PREFILLED_SYRINGE | INTRAVENOUS | Status: DC | PRN
  Administered 2021-03-23: 60 mg via INTRAVENOUS

## 2021-03-23 MED ORDER — SUGAMMADEX SODIUM 200 MG/2ML IV SOLN
INTRAVENOUS | Status: DC | PRN
  Administered 2021-03-23: 200 mg via INTRAVENOUS

## 2021-03-23 MED ORDER — PHENYLEPHRINE HCL-NACL 20-0.9 MG/250ML-% IV SOLN
INTRAVENOUS | Status: DC | PRN
  Administered 2021-03-23: 75 ug/min via INTRAVENOUS

## 2021-03-23 SURGICAL SUPPLY — 42 items
APPLIER CLIP 9.375 MED OPEN (MISCELLANEOUS) ×5
BINDER BREAST LRG (GAUZE/BANDAGES/DRESSINGS) IMPLANT
BINDER BREAST XLRG (GAUZE/BANDAGES/DRESSINGS) ×2 IMPLANT
CANISTER SUCT 3000ML PPV (MISCELLANEOUS) ×5 IMPLANT
CHLORAPREP W/TINT 26 (MISCELLANEOUS) ×5 IMPLANT
CLIP APPLIE 9.375 MED OPEN (MISCELLANEOUS) ×3 IMPLANT
CNTNR URN SCR LID CUP LEK RST (MISCELLANEOUS) ×3 IMPLANT
CONT SPEC 4OZ STRL OR WHT (MISCELLANEOUS) ×4
COVER PROBE W GEL 5X96 (DRAPES) ×7 IMPLANT
COVER SURGICAL LIGHT HANDLE (MISCELLANEOUS) ×5 IMPLANT
DERMABOND ADVANCED (GAUZE/BANDAGES/DRESSINGS) ×6
DERMABOND ADVANCED .7 DNX12 (GAUZE/BANDAGES/DRESSINGS) ×3 IMPLANT
DEVICE DUBIN SPECIMEN MAMMOGRA (MISCELLANEOUS) ×7 IMPLANT
DRAPE CHEST BREAST 15X10 FENES (DRAPES) ×5 IMPLANT
ELECT COATED BLADE 2.86 ST (ELECTRODE) ×5 IMPLANT
ELECT REM PT RETURN 9FT ADLT (ELECTROSURGICAL) ×5
ELECTRODE REM PT RTRN 9FT ADLT (ELECTROSURGICAL) ×3 IMPLANT
GAUZE SPONGE 4X4 12PLY STRL (GAUZE/BANDAGES/DRESSINGS) ×4 IMPLANT
GAUZE SPONGE 4X4 12PLY STRL LF (GAUZE/BANDAGES/DRESSINGS) ×2 IMPLANT
GLOVE SURG ENC MOIS LTX SZ7.5 (GLOVE) ×10 IMPLANT
GOWN STRL REUS W/ TWL LRG LVL3 (GOWN DISPOSABLE) ×6 IMPLANT
GOWN STRL REUS W/TWL LRG LVL3 (GOWN DISPOSABLE) ×8
KIT BASIN OR (CUSTOM PROCEDURE TRAY) ×5 IMPLANT
KIT MARKER MARGIN INK (KITS) ×5 IMPLANT
LIGHT WAVEGUIDE WIDE FLAT (MISCELLANEOUS) IMPLANT
NDL 18GX1X1/2 (RX/OR ONLY) (NEEDLE) IMPLANT
NDL FILTER BLUNT 18X1 1/2 (NEEDLE) IMPLANT
NDL HYPO 25GX1X1/2 BEV (NEEDLE) ×3 IMPLANT
NEEDLE 18GX1X1/2 (RX/OR ONLY) (NEEDLE) IMPLANT
NEEDLE FILTER BLUNT 18X 1/2SAF (NEEDLE)
NEEDLE FILTER BLUNT 18X1 1/2 (NEEDLE) IMPLANT
NEEDLE HYPO 25GX1X1/2 BEV (NEEDLE) ×5 IMPLANT
NS IRRIG 1000ML POUR BTL (IV SOLUTION) ×5 IMPLANT
PACK GENERAL/GYN (CUSTOM PROCEDURE TRAY) ×5 IMPLANT
PAD ABD 7.5X8 STRL (GAUZE/BANDAGES/DRESSINGS) ×6 IMPLANT
SUT MNCRL AB 4-0 PS2 18 (SUTURE) ×12 IMPLANT
SUT SILK 2 0 SH (SUTURE) IMPLANT
SUT VIC AB 3-0 SH 18 (SUTURE) ×5 IMPLANT
SYR CONTROL 10ML LL (SYRINGE) ×7 IMPLANT
TOWEL GREEN STERILE (TOWEL DISPOSABLE) ×5 IMPLANT
TOWEL GREEN STERILE FF (TOWEL DISPOSABLE) ×5 IMPLANT
TRACER MAGTRACE VIAL (MISCELLANEOUS) ×2 IMPLANT

## 2021-03-23 NOTE — Anesthesia Procedure Notes (Signed)
Anesthesia Regional Block: Pectoralis block   Pre-Anesthetic Checklist: , timeout performed,  Correct Patient, Correct Site, Correct Laterality,  Correct Procedure, Correct Position, site marked,  Risks and benefits discussed,  Surgical consent,  Pre-op evaluation,  At surgeon's request and post-op pain management  Laterality: Right  Prep: Dura Prep       Needles:  Injection technique: Single-shot  Needle Type: Echogenic Stimulator Needle     Needle Length: 5cm  Needle Gauge: 20     Additional Needles:   Procedures:,,,, ultrasound used (permanent image in chart),,    Narrative:  Start time: 03/23/2021 7:57 AM End time: 03/23/2021 8:00 AM Injection made incrementally with aspirations every 5 mL.  Performed by: Personally  Anesthesiologist: Darral Dash, DO  Additional Notes: Patient identified. Risks/Benefits/Options discussed with patient including but not limited to bleeding, infection, nerve damage, failed block, incomplete pain control. Patient expressed understanding and wished to proceed. All questions were answered. Sterile technique was used throughout the entire procedure. Please see nursing notes for vital signs. Aspirated in 5cc intervals with injection for negative confirmation. Patient was given instructions on fall risk and not to get out of bed. All questions and concerns addressed with instructions to call with any issues or inadequate analgesia.

## 2021-03-23 NOTE — H&P (Signed)
REFERRING PHYSICIAN: Cleotis Nipper*  PROVIDER: Landry Corporal, MD  MRN: [id number] DOB: 12/04/1970 Subjective  Chief Complaint: Breast Cancer   History of Present Illness: Veronica Santos is a 50 y.o. female who is seen today as an office consultation at the request of Dr. Marisue Humble for evaluation of Breast Cancer .   We are asked to see the patient in consultation by Dr. Lindi Adie to evaluate her for a new right breast cancer. The patient is a 50 year old white female who recently got a screening MRI for being high risk for breast cancer due to her family history. At that time she was found to have an 11 mm mass in the lower outer quadrant of the right breast. The lymph nodes looked normal. 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 The tumor markers have not been reported yet. She also had an 8 mm mass in the subareolar left breast that was biopsied and came back as an adenoma. She does have a family history of breast cancer in her grandmother and colon cancer in her father. She is otherwise in good health and does not smoke.  Review of Systems: A complete review of systems was obtained from the patient. I have reviewed this information and discussed as appropriate with the patient. See HPI as well for other ROS.  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   No current facility-administered medications on file prior to visit.   Family History  Problem Relation Age of Onset   Diabetes Mother   Skin  cancer Father   Colon cancer Father   Hyperlipidemia (Elevated cholesterol) Father   High blood pressure (Hypertension) Father    Social History   Tobacco Use  Smoking Status Never  Smokeless Tobacco Never    Social History   Socioeconomic History   Marital status: Married  Tobacco Use   Smoking status: Never   Smokeless tobacco: Never  Substance and Sexual Activity   Alcohol use: Yes   Drug use: Never   Objective:   Vitals:  BP: 122/84  Pulse: (!) 116  Weight: 70.8 kg (156 lb)  Height: 170.2 cm (5\' 7" )   Body mass index is 24.43 kg/m.  Physical Exam Vitals reviewed.  Constitutional:  General: She is not in acute distress. Appearance: Normal appearance.  HENT:  Head: Normocephalic and atraumatic.  Right Ear: External ear normal.  Left Ear: External ear normal.  Nose: Nose normal.  Mouth/Throat:  Mouth: Mucous membranes are moist.  Pharynx: Oropharynx is clear.  Eyes:  General: No scleral icterus. Extraocular Movements: Extraocular movements intact.  Conjunctiva/sclera: Conjunctivae normal.  Pupils: Pupils are equal, round, and reactive to light.  Cardiovascular:  Rate and Rhythm: Normal rate and regular rhythm.  Pulses: Normal pulses.  Heart sounds: Normal heart sounds.  Pulmonary:  Effort: Pulmonary effort is normal. No respiratory distress.  Breath sounds: Normal breath sounds.  Abdominal:  General: Bowel sounds are normal.  Palpations: Abdomen is soft.  Tenderness: There is no abdominal tenderness.  Musculoskeletal:  General: No swelling, tenderness or deformity. Normal range of motion.  Cervical back: Normal range of motion and neck supple.  Skin: General: Skin is warm and dry.  Coloration: Skin is not jaundiced.  Neurological:  General: No focal deficit present.  Mental Status: She is alert and oriented to person, place, and time.  Psychiatric:  Mood and Affect: Mood normal.  Behavior: Behavior normal.     Breast: There is no  palpable mass in either breast. There is no palpable axillary, supraclavicular, or cervical lymphadenopathy.  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 I have discussed with her in detail the different options for treatment of the cancer and at this point she favors breast conservation which I feel is reasonable. She will be a good candidate for sentinel node biopsy as well. I have discussed with her in detail the risks and benefits of the operation as well as some of the technical aspects including the use of a radioactive seed for localization and she understands. We will wait for her tumor markers to be reported before proceeding with scheduling. If she has unfavorable markers then she may benefit from neoadjuvant chemotherapy upfront. She will meet with medical oncology tomorrow and we will finalize our plan once the tumor markers are back

## 2021-03-23 NOTE — Anesthesia Postprocedure Evaluation (Signed)
Anesthesia Post Note  Patient: Veronica Santos  Procedure(s) Performed: RIGHT BREAST LUMPECTOMY WITH RADIOACTIVE SEED (Right: Breast) LEFT BREAST LUMPECTOMY WITH RADIOACTIVE SEED LOCALIZATION (Left: Breast) RIGHT AXILLARY SENTINEL NODE BIOPSY WITH MAGTRACE INJECTION (Right: Axilla)     Patient location during evaluation: PACU Anesthesia Type: Regional and General Level of consciousness: awake and alert Pain management: pain level controlled Vital Signs Assessment: post-procedure vital signs reviewed and stable Respiratory status: spontaneous breathing, nonlabored ventilation, respiratory function stable and patient connected to nasal cannula oxygen Cardiovascular status: blood pressure returned to baseline and stable Postop Assessment: no apparent nausea or vomiting Anesthetic complications: no   No notable events documented.  Last Vitals:  Vitals:   03/23/21 1036 03/23/21 1051  BP: 119/82 127/84  Pulse: 83 80  Resp: 18 (!) 23  Temp:    SpO2: 93% 97%    Last Pain:  Vitals:   03/23/21 1051  TempSrc:   PainSc: 7                  Humbert Morozov P Dionisio Aragones

## 2021-03-23 NOTE — Transfer of Care (Signed)
Immediate Anesthesia Transfer of Care Note  Patient: Veronica Santos  Procedure(s) Performed: RIGHT BREAST LUMPECTOMY WITH RADIOACTIVE SEED (Right: Breast) LEFT BREAST LUMPECTOMY WITH RADIOACTIVE SEED LOCALIZATION (Left: Breast) RIGHT AXILLARY SENTINEL NODE BIOPSY WITH MAGTRACE INJECTION (Right: Axilla)  Patient Location: PACU  Anesthesia Type:General  Level of Consciousness: drowsy, patient cooperative and responds to stimulation  Airway & Oxygen Therapy: Patient Spontanous Breathing and Patient connected to nasal cannula oxygen  Post-op Assessment: Report given to RN and Post -op Vital signs reviewed and stable  Post vital signs: Reviewed and stable  Last Vitals:  Vitals Value Taken Time  BP 123/81 03/23/21 1021  Temp    Pulse 100 03/23/21 1021  Resp 20 03/23/21 1021  SpO2 99 % 03/23/21 1021  Vitals shown include unvalidated device data.  Last Pain:  Vitals:   03/23/21 0647  TempSrc:   PainSc: 0-No pain         Complications: No notable events documented.

## 2021-03-23 NOTE — Anesthesia Procedure Notes (Signed)
Procedure Name: Intubation Date/Time: 03/23/2021 8:29 AM Performed by: Cathren Harsh, CRNA Pre-anesthesia Checklist: Patient identified, Emergency Drugs available, Suction available and Patient being monitored Patient Re-evaluated:Patient Re-evaluated prior to induction Oxygen Delivery Method: Circle System Utilized Preoxygenation: Pre-oxygenation with 100% oxygen Induction Type: IV induction Ventilation: Mask ventilation without difficulty Laryngoscope Size: Mac and 4 (mac 3 unavailable) Grade View: Grade II Tube type: Oral Tube size: 7.0 mm Number of attempts: 1 Airway Equipment and Method: Stylet and Oral airway Placement Confirmation: ETT inserted through vocal cords under direct vision, positive ETCO2 and breath sounds checked- equal and bilateral Secured at: 21 cm Tube secured with: Tape Dental Injury: Teeth and Oropharynx as per pre-operative assessment

## 2021-03-23 NOTE — Op Note (Addendum)
03/23/2021  10:13 AM  PATIENT:  Veronica Santos  50 y.o. female  PRE-OPERATIVE DIAGNOSIS:  RIGHT BREAST CANCER, LEFT BREAST ADENOMA  POST-OPERATIVE DIAGNOSIS:  RIGHT BREAST CANCER, LEFT BREAST ADENOMA  PROCEDURE:  Procedure(s): RIGHT BREAST LUMPECTOMY WITH RADIOACTIVE SEED LOCALIZATION AND DEEP RIGHT AXILLARY SENTINEL LYMPH NODE BIOPSY LEFT BREAST LUMPECTOMY WITH RADIOACTIVE SEED LOCALIZATION (Left)  SURGEON:  Surgeon(s) and Role:    * Jovita Kussmaul, MD - Primary  PHYSICIAN ASSISTANT:   ASSISTANTS: none   ANESTHESIA:   local and general  EBL:  Minimal   BLOOD ADMINISTERED:none  DRAINS: none   LOCAL MEDICATIONS USED:  MARCAINE     SPECIMEN:  Source of Specimen:  left breast tissue, right breast tissue and sentinel nodes x 2  DISPOSITION OF SPECIMEN:  PATHOLOGY  COUNTS:  YES  TOURNIQUET:  * No tourniquets in log *  DICTATION: .Dragon Dictation  After informed consent was obtained the patient was brought to the operating room and placed in the supine position on the operating table.  After adequate induction of general anesthesia the patient's bilateral chest, breast, and axillary areas were prepped with ChloraPrep, allowed to dry, and draped in usual sterile manner.  An appropriate timeout was performed.  Previously an I-125 seed was placed in the lower outer right breast to mark an area of invasive breast cancer and another I-125 seed was placed in the subareolar left breast to mark an area of an adenoma.  At this point 2 cc of iron oxide were injected into the subareolar plexus on the right and the breast was massaged for several minutes.  Attention was then turned to the left breast.  The neoprobe was set to I-125 in the area of radioactivity was readily identified in the upper subareolar area.  The area around this was infiltrated with quarter percent Marcaine.  A curvilinear incision was made along the upper edge of the areola of the left breast with a 15 blade knife.  The  incision was carried through the skin and subcutaneous tissue sharply with the electrocautery.  Dissection was then carried from this incision along the subareolar space behind the nipple.  A circular portion of breast tissue was then excised sharply with the electrocautery around the radioactive seed while checking the area of radioactivity frequently.  The seed and clip were both identified on the anterior surface of the lumpectomy specimen but this location was very superficial behind the nipple.  The clip and seed were sent separate from the specimen itself.  Once the specimen of tissue was removed it was oriented with the appropriate paint colors.  The specimen was then sent to pathology for further evaluation.  Hemostasis was achieved using the Bovie electrocautery.  The wound was irrigated with saline and infiltrated with more quarter percent Marcaine.  The deep layer of the incision was closed with interrupted 3-0 Vicryl stitches.  The skin was then closed with interrupted 4-0 Monocryl subcuticular stitches.  Dermabond dressings were applied.  The patient tolerated the procedure well.  Attention was then turned to the right breast.  The mag trace was used to identify a signal in the right axilla.  This area was infiltrated with quarter percent Marcaine.  A curvilinear incision was made transversely overlying the area of signal.  The incision was carried through the skin and subcutaneous tissue sharply with the electrocautery until the deep right axillary space was entered.  The mag trace was used to identify 2 hot lymph nodes.  Each of  these was excised sharply with the electrocautery and the surrounding small vessels and lymphatics were controlled with clips.  These were sent as sentinel nodes numbers 1 and 2.  No other hot or palpable nodes were identified in the right axilla.  Hemostasis was achieved using the Bovie electrocautery.  The deep layer of the incision was closed with interrupted 3-0 Vicryl  stitches.  The skin was closed with a running 4-0 Monocryl subcuticular stitch.  Attention was then turned to the right breast.  The neoprobe was set to I-125 in the area of radioactivity was readily identified in the lower outer quadrant of the right breast.  The seed signal was very superficial to the skin and because of this I could not place the incision in the inframammary fold.  I made an elliptical incision in the skin overlying the area of radioactivity with a 15 blade knife.  The incision was carried through the skin and subcutaneous tissue sharply with the electrocautery.  Dissection was carried widely around the radioactive seed while checking the area of radioactivity frequently.  This dissection was carried all the way to the chest wall.  Once the specimen was removed it was oriented with the appropriate paint colors.  A specimen radiograph was obtained that showed the seed to be near the center of the specimen.  The clip was also included at the edge but the clip had migrated away after the initial biopsy.  The specimen was then sent to pathology for further evaluation.  Hemostasis was achieved using the Bovie electrocautery.  The wound was irrigated with saline and infiltrated with more quarter percent Marcaine.  The deep layer of the incision was closed with layers of interrupted 3-0 Vicryl stitches.  The skin was then closed with a running 4 Monocryl subcuticular stitch.  Dermabond dressings were applied.  The patient tolerated the procedure well.  At the end of the case all needle sponge and instrument counts were correct.  The patient was then awakened and taken to recovery in stable condition.  PLAN OF CARE: Discharge to home after PACU  PATIENT DISPOSITION:  PACU - hemodynamically stable.   Delay start of Pharmacological VTE agent (>24hrs) due to surgical blood loss or risk of bleeding: not applicable

## 2021-03-23 NOTE — Interval H&P Note (Signed)
History and Physical Interval Note:  03/23/2021 7:41 AM  Veronica Santos  has presented today for surgery, with the diagnosis of RIGHT BREAST CANCER, LEFT BREAST ADENOMA.  The various methods of treatment have been discussed with the patient and family. After consideration of risks, benefits and other options for treatment, the patient has consented to  Procedure(s): RIGHT BREAST LUMPECTOMY WITH RADIOACTIVE SEED AND SENTINEL LYMPH NODE BIOPSY (Right) LEFT BREAST LUMPECTOMY WITH RADIOACTIVE SEED LOCALIZATION (Left) as a surgical intervention.  The patient's history has been reviewed, patient examined, no change in status, stable for surgery.  I have reviewed the patient's chart and labs.  Questions were answered to the patient's satisfaction.     Autumn Messing III

## 2021-03-24 LAB — SURGICAL PATHOLOGY

## 2021-03-30 ENCOUNTER — Encounter: Payer: Self-pay | Admitting: *Deleted

## 2021-04-03 ENCOUNTER — Encounter: Payer: Self-pay | Admitting: *Deleted

## 2021-04-03 ENCOUNTER — Telehealth: Payer: Self-pay | Admitting: Licensed Clinical Social Worker

## 2021-04-03 ENCOUNTER — Inpatient Hospital Stay: Payer: 59 | Attending: Hematology and Oncology | Admitting: Hematology and Oncology

## 2021-04-03 ENCOUNTER — Other Ambulatory Visit: Payer: Self-pay

## 2021-04-03 ENCOUNTER — Encounter: Payer: Self-pay | Admitting: Licensed Clinical Social Worker

## 2021-04-03 ENCOUNTER — Ambulatory Visit: Payer: Self-pay | Admitting: Licensed Clinical Social Worker

## 2021-04-03 DIAGNOSIS — Z803 Family history of malignant neoplasm of breast: Secondary | ICD-10-CM

## 2021-04-03 DIAGNOSIS — C50511 Malignant neoplasm of lower-outer quadrant of right female breast: Secondary | ICD-10-CM | POA: Insufficient documentation

## 2021-04-03 DIAGNOSIS — Z1379 Encounter for other screening for genetic and chromosomal anomalies: Secondary | ICD-10-CM | POA: Insufficient documentation

## 2021-04-03 DIAGNOSIS — Z8042 Family history of malignant neoplasm of prostate: Secondary | ICD-10-CM

## 2021-04-03 DIAGNOSIS — Z800 Family history of malignant neoplasm of digestive organs: Secondary | ICD-10-CM

## 2021-04-03 DIAGNOSIS — Z170 Estrogen receptor positive status [ER+]: Secondary | ICD-10-CM | POA: Insufficient documentation

## 2021-04-03 NOTE — Telephone Encounter (Signed)
Revealed negative genetic testing.   This normal result is reassuring and indicates that it is unlikely Veronica Santos cancer is due to a hereditary cause.  It is unlikely that there is an increased risk of another cancer due to a mutation in one of these genes.  However, genetic testing is not perfect, and cannot definitively rule out a hereditary cause.  It will be important for her to keep in contact with genetics to learn if any additional testing may be needed in the future.

## 2021-04-03 NOTE — Assessment & Plan Note (Signed)
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 I discussed the margins as well as lymph node surgeries. We also discussed the final staging along with previously performed ER/PR and HER-2/neu testing.  Treatment plan: 1.  No role of Oncotype DX testing for the tumor that is 0.4 cm grade 1 2. adjuvant radiation 3.  Followed by adjuvant antiestrogen therapy  Return to clinic after radiation to discuss antiestrogen therapy

## 2021-04-03 NOTE — Progress Notes (Signed)
HPI:  Veronica Santos was previously seen in the [facility] due to a personal and family history of cancer and concerns regarding a hereditary predisposition to cancer. Please refer to our prior cancer genetics clinic note for more information regarding our discussion, assessment and recommendations, at the time. Ms. Abernathy recent genetic test results were disclosed to her, as were recommendations warranted by these results. These results and recommendations are discussed in more detail below.  CANCER HISTORY:  Oncology History  Malignant neoplasm of lower-outer quadrant of right breast of female, estrogen receptor positive ([facility])  02/21/2021 Initial Diagnosis   MRI Breast: a dominant mass in the inferolateral right breast, and a mass in the left retroareolar breast. Biopsy: invasive mammary carcinoma and mammary carcinoma in situ with calcifications in the right breast, and no malignancy in the left breast.    02/22/2021 Cancer Staging   Staging form: Breast, AJCC 8th Edition - Clinical stage from 02/22/2021: Stage IA (cT1c, cN0, cM0, G2, ER+, PR+, HER2-) - Signed by Nicholas Lose, MD on 02/22/2021 Stage prefix: Initial diagnosis Histologic grading system: 3 grade system     Genetic Testing   Negative genetic testing. No pathogenic variants identified on the [facility] CustomNext+RNA panel. The report date is 03/31/2021.  The CustomNext-Cancer + RNAinsight panel includes sequencing and/or deletion duplication testing of the following 47 genes: APC, ATM, AXIN2, BARD1, BMPR1A, BRCA1, BRCA2, BRIP1, CDH1, CDKN2A (p14ARF), CDKN2A (p16INK4a), CKD4, CHEK2, CTNNA1, DICER1, EPCAM (Deletion/duplication testing only), GREM1 (promoter region deletion/duplication testing only), KIT, MEN1, MLH1, MSH2, MSH3, MSH6, MUTYH, NBN, NF1, NHTL1, PALB2, PDGFRA, PMS2, POLD1, POLE, PTEN, RAD50, RAD51C, RAD51D, SDHB, SDHC, SDHD, SMAD4, SMARCA4. STK11, TP53, TSC1, TSC2, and VHL.  The following genes were  evaluated for sequence changes only: SDHA and HOXB13 c.251G>A variant only.      FAMILY HISTORY:  We obtained a detailed, 4-generation family history.  Significant diagnoses are listed below: Family History  Problem Relation Age of Onset   Colon cancer Father 50   Prostate cancer Paternal Uncle 67   Breast cancer Paternal Grandmother        dx 57s   Ms. Labrador has 2 sons (51 and 16) and 1 daughter (55), no cancers. She has 1 brother (59) no cancers.    Ms. Wilhide mother is living at 67 with no history of cancer. Patient has 1 maternal aunt, 1 maternal uncle, and 4 maternal cousins, no cancers. Maternal grandmother died at 39, grandfather died in his 75s.   Ms. Rubis father was diagnosed with colon cancer at 24 and is living at 60. Patient has 1 paternal uncle who had prostate cancer recently at 32. No paternal first cousins. Paternal grandmother had breast cancer at 36 and died in her 70s and she had a sister who had lung cancer with no history of smoking. Paternal grandfather died of a stroke in his late 76s.   Ms. Kusch is unaware of previous family history of genetic testing for hereditary cancer risks. Patient's maternal ancestors are of English/Irish descent, and paternal ancestors are of English/Irish descent. There is no reported Ashkenazi Jewish ancestry. There is no known consanguinity.     GENETIC TEST RESULTS: Genetic testing reported out on 12/27 through the [facility] and on 03/31/2021 through the CancerNext-Expanded+RNA panel found no pathogenic mutations.   The CancerNext-Expanded + RNAinsight gene panel offered by [facility] and includes sequencing and rearrangement analysis for the following 77 genes: IP, ALK, APC*, ATM*, AXIN2, BAP1, BARD1, BLM, BMPR1A, BRCA1*,  BRCA2*, BRIP1*, CDC73, CDH1*,CDK4, CDKN1B, CDKN2A, CHEK2*, CTNNA1, DICER1, FANCC, FH, FLCN, GALNT12, KIF1B, LZTR1, MAX, MEN1, MET, MLH1*, MSH2*, MSH3, MSH6*, MUTYH*, NBN, NF1*, NF2, NTHL1, PALB2*,  PHOX2B, PMS2*, POT1, PRKAR1A, PTCH1, PTEN*, RAD51C*, RAD51D*,RB1, RECQL, RET, SDHA, SDHAF2, SDHB, SDHC, SDHD, SMAD4, SMARCA4, SMARCB1, SMARCE1, STK11, SUFU, TMEM127, TP53*,TSC1, TSC2, VHL and XRCC2 (sequencing and deletion/duplication); EGFR, EGLN1, HOXB13, KIT, MITF, PDGFRA, POLD1 and POLE (sequencing only); EPCAM and GREM1 (deletion/duplication only).  The test report has been scanned into EPIC and is located under the Molecular Pathology section of the Results Review tab.  A portion of the result report is included below for reference.     We discussed that because current genetic testing is not perfect, it is possible there may be a gene mutation in one of these genes that current testing cannot detect, but that chance is small.  There could be another gene that has not yet been discovered, or that we have not yet tested, that is responsible for the cancer diagnoses in the family. It is also possible there is a hereditary cause for the cancer in the family that Ms. Rossy did not inherit and therefore was not identified in her testing.  Therefore, it is important to remain in touch with cancer genetics in the future so that we can continue to offer Ms. Boyington the most up to date genetic testing.   ADDITIONAL GENETIC TESTING: We discussed with Ms. Rigaud that her genetic testing was fairly extensive.  If there are genes identified to increase cancer risk that can be analyzed in the future, we would be happy to discuss and coordinate this testing at that time.    CANCER SCREENING RECOMMENDATIONS: Ms. Gau test result is considered negative (normal).  This means that we have not identified a hereditary cause for her  personal and family history of cancer at this time. Most cancers happen by chance and this negative test suggests that her cancer may fall into this category.    While reassuring, this does not definitively rule out a hereditary predisposition to cancer. It is still possible that  there could be genetic mutations that are undetectable by current technology. There could be genetic mutations in genes that have not been tested or identified to increase cancer risk.  Therefore, it is recommended she continue to follow the cancer management and screening guidelines provided by her oncology and primary healthcare provider.   An individual's cancer risk and medical management are not determined by genetic test results alone. Overall cancer risk assessment incorporates additional factors, including personal medical history, family history, and any available genetic information that may result in a personalized plan for cancer prevention and surveillance.  RECOMMENDATIONS FOR FAMILY MEMBERS:  Relatives in this family might be at some increased risk of developing cancer, over the general population risk, simply due to the family history of cancer.  We recommended female relatives in this family have a yearly mammogram beginning at age 71, or 65 years younger than the earliest onset of cancer, an annual clinical breast exam, and perform monthly breast self-exams. Female relatives in this family should also have a gynecological exam as recommended by their primary provider.  All family members should be referred for colonoscopy starting at age 8.   FOLLOW-UP: Lastly, we discussed with Ms. Quintela that cancer genetics is a rapidly advancing field and it is possible that new genetic tests will be appropriate for her and/or her family members in the future. We encouraged her to remain in  contact with cancer genetics on an annual basis so we can update her personal and family histories and let her know of advances in cancer genetics that may benefit this family.   Our contact number was provided. Ms. Gaona questions were answered to her satisfaction, and she knows she is welcome to call us at anytime with additional questions or concerns.   Faith Rogue, MS, [facility] Genetic  Counselor [email or web address] Phone: [phone]

## 2021-04-03 NOTE — Progress Notes (Signed)
Patient Care Team: Gaynelle Arabian, MD as PCP - General (Family Medicine) Rockwell Germany, RN as Oncology Nurse Navigator Mauro Kaufmann, RN as Oncology Nurse Navigator Nicholas Lose, MD as Consulting Physician (Hematology and Oncology) Jovita Kussmaul, MD as Consulting Physician (General Surgery) Eppie Gibson, MD as Attending Physician (Radiation Oncology)  DIAGNOSIS:  Encounter Diagnosis  Name Primary?   Malignant neoplasm of lower-outer quadrant of right breast of female, estrogen receptor positive ([place])     SUMMARY OF ONCOLOGIC HISTORY: Oncology History  Malignant neoplasm of lower-outer quadrant of right breast of female, estrogen receptor positive ([place])  02/21/2021 Initial Diagnosis   MRI Breast: a dominant mass in the inferolateral right breast, and a mass in the left retroareolar breast. Biopsy: invasive mammary carcinoma and mammary carcinoma in situ with calcifications in the right breast, and no malignancy in the left breast.    02/22/2021 Cancer Staging   Staging form: Breast, AJCC 8th Edition - Clinical stage from 02/22/2021: Stage IA (cT1c, cN0, cM0, G2, ER+, PR+, HER2-) - Signed by Nicholas Lose, MD on 02/22/2021 Stage prefix: Initial diagnosis Histologic grading system: 3 grade system     Genetic Testing   Negative genetic testing. No pathogenic variants identified on the [facility] CustomNext+RNA panel. The report date is 03/31/2021.  The CustomNext-Cancer + RNAinsight panel includes sequencing and/or deletion duplication testing of the following 47 genes: APC, ATM, AXIN2, BARD1, BMPR1A, BRCA1, BRCA2, BRIP1, CDH1, CDKN2A (p14ARF), CDKN2A (p16INK4a), CKD4, CHEK2, CTNNA1, DICER1, EPCAM (Deletion/duplication testing only), GREM1 (promoter region deletion/duplication testing only), KIT, MEN1, MLH1, MSH2, MSH3, MSH6, MUTYH, NBN, NF1, NHTL1, PALB2, PDGFRA, PMS2, POLD1, POLE, PTEN, RAD50, RAD51C, RAD51D, SDHB, SDHC, SDHD, SMAD4, SMARCA4. STK11, TP53, TSC1, TSC2, and VHL.  The  following genes were evaluated for sequence changes only: SDHA and HOXB13 c.251G>A variant only.    03/23/2021 Surgery   Left lumpectomy: Intraductal papilloma, CSL and UDH Right lumpectomy: 0.4 cm grade 1 IDC ER 100%, PR 100%, HER2 negative, Ki-67 15%, 0/7 lymph nodes negative      CHIEF COMPLIANT: Follow-up after left lumpectomy  INTERVAL HISTORY: Veronica Santos is a 51 year old above-mentioned history of breast cancer who underwent lumpectomy and is here today to discuss pathology report.  Her only complaint is the binder is causing her discomfort in the right chest wall.  She has an appointment to see Dr. Marlou Starks in a couple of days.   ALLERGIES:  is allergic to penicillins and sulfa antibiotics.  MEDICATIONS:  Current Outpatient Medications  Medication Sig Dispense Refill   HYDROcodone-acetaminophen (NORCO/VICODIN) 5-325 MG tablet Take 1-2 tablets by mouth every 4 (four) hours as needed for moderate pain or severe pain. 15 tablet 0   metroNIDAZOLE (METROCREAM) 0.75 % cream Apply 1 application topically at bedtime.     Multiple Vitamins-Minerals (MULTIVITAMIN WITH MINERALS) tablet Take 1 tablet by mouth daily.     tamoxifen (NOLVADEX) 20 MG tablet Take 1 tablet (20 mg total) by mouth daily. (Patient not taking: Reported on 03/09/2021) 30 tablet 0   No current facility-administered medications for this visit.    PHYSICAL EXAMINATION: ECOG PERFORMANCE STATUS: 1 - Symptomatic but completely ambulatory  Vitals:   04/03/21 1503  BP: 129/73  Pulse: 80  Resp: 18  Temp: 97.9 F (36.6 C)  SpO2: 100%   Filed Weights   04/03/21 1503  Weight: 153 lb 14.4 oz (69.8 kg)       Lab Results  Component Value Date   WBC 6.4 03/10/2021   HGB 13.6  03/10/2021   HCT 40.1 03/10/2021   MCV 92.8 03/10/2021   PLT 293 03/10/2021    ASSESSMENT & PLAN:  Malignant neoplasm of lower-outer quadrant of right breast of female, estrogen receptor positive ([place]) 03/23/2021:Left lumpectomy:  Intraductal papilloma, CSL and UDH Right lumpectomy: 0.4 cm grade 1 IDC ER 100%, PR 100%, HER2 negative, Ki-67 15%, 0/7 lymph nodes negative  Pathology counseling: I discussed the final pathology report of the patient provided  a copy of this report. I discussed the margins as well as lymph node surgeries. We also discussed the final staging along with previously performed ER/PR and HER-2/neu testing.  Treatment plan: 1.  No role of Oncotype DX testing for the tumor that is 0.4 cm grade 1 2. adjuvant radiation 3.  Followed by adjuvant antiestrogen therapy  Return to clinic after radiation to discuss antiestrogen therapy    No orders of the defined types were placed in this encounter.  The patient has a good understanding of the overall plan. she agrees with it. she will call with any problems that may develop before the next visit here. Total time spent: 30 mins including face to face time and time spent for planning, charting and co-ordination of care   Harriette Ohara, MD 04/03/21

## 2021-04-13 ENCOUNTER — Ambulatory Visit: Payer: Managed Care, Other (non HMO) | Admitting: Rehabilitation

## 2021-04-17 ENCOUNTER — Encounter: Payer: Self-pay | Admitting: Rehabilitation

## 2021-04-17 ENCOUNTER — Ambulatory Visit: Payer: 59 | Attending: Hematology and Oncology | Admitting: Rehabilitation

## 2021-04-17 ENCOUNTER — Other Ambulatory Visit: Payer: Self-pay

## 2021-04-17 DIAGNOSIS — Z483 Aftercare following surgery for neoplasm: Secondary | ICD-10-CM

## 2021-04-17 DIAGNOSIS — Z170 Estrogen receptor positive status [ER+]: Secondary | ICD-10-CM | POA: Diagnosis present

## 2021-04-17 DIAGNOSIS — R293 Abnormal posture: Secondary | ICD-10-CM

## 2021-04-17 DIAGNOSIS — C50911 Malignant neoplasm of unspecified site of right female breast: Secondary | ICD-10-CM

## 2021-04-17 NOTE — Patient Instructions (Addendum)
° ° °   [facility]  [address], [address]  [place] [address]  [phone]  After Breast Cancer Class It is recommended you attend the ABC class to be educated on lymphedema risk reduction. This class is free of charge and lasts for 1 hour. It is a 1-time class. You will need to download the Webex app either on your phone or computer. We will send you a link the night before or the morning of the class. You should be able to click on that link to join the class. This is not a confidential class. You don't have to turn your camera on, but other participants may be able to see your email address.  Scar massage You can begin gentle scar massage to you incision sites. Gently place one hand on the incision and move the skin (without sliding on the skin) in various directions. Do this for a few minutes and then you can gently massage either coconut oil or vitamin E cream into the scars.  Compression garment You should continue wearing your compression bra until you feel like you no longer have swelling.  Home exercise Program Continue doing the exercises you were given until you feel like you can do them without feeling any tightness at the end.   Walking Program Studies show that 30 minutes of walking per day (fast enough to elevate your heart rate) can significantly reduce the risk of a cancer recurrence. If you can't walk due to other medical reasons, we encourage you to find another activity you could do (like a stationary bike or water exercise).  Posture After breast cancer surgery, people frequently sit with rounded shoulders posture because it puts their incisions on slack and feels better. If you sit like this and scar tissue forms in that position, you can become very tight and have pain sitting or standing with good posture. Try to be aware of your posture and sit and stand up tall to heal properly.  Follow up PT: It is recommended you return every 3 months for  the first 3 years following surgery to be assessed on the SOZO machine for an L-Dex score. This helps prevent clinically significant lymphedema in 95% of patients. These follow up screens are 10 minute appointments that you are not billed for. - 06/05/21

## 2021-04-17 NOTE — Therapy (Signed)
[facility] @ [address] [place], [place], [address] Phone: [phone]   Fax:  [phone]  Physical Therapy Treatment  Patient Details  Name: Veronica Santos MRN: [id number] Date of Birth: 1970/07/28 Referring Provider (PT): Dr. Lindi Adie   Encounter Date: 04/17/2021   PT End of Session - 04/17/21 1333     Visit Number 2    Number of Visits 2    Date for PT Re-Evaluation 04/25/21    PT Start Time 8590    PT Stop Time 1330    PT Time Calculation (min) 28 min    Activity Tolerance Patient tolerated treatment well    Behavior During Therapy [facility] for tasks assessed/performed             Past Medical History:  Diagnosis Date   Cancer ([place])    Family history of breast cancer    Family history of colon cancer    Family history of prostate cancer    Pneumonia     Past Surgical History:  Procedure Laterality Date   AXILLARY SENTINEL NODE BIOPSY Right 03/23/2021   Procedure: RIGHT AXILLARY SENTINEL NODE BIOPSY WITH MAGTRACE INJECTION;  Surgeon: Jovita Kussmaul, MD;  Location: [place] [place];  Service: General;  Laterality: Right;   BREAST BIOPSY Left 02/08/2021   BREAST BIOPSY Right 02/08/2021   BREAST LUMPECTOMY WITH RADIOACTIVE SEED AND SENTINEL LYMPH NODE BIOPSY Right 03/23/2021   Procedure: RIGHT BREAST LUMPECTOMY WITH RADIOACTIVE SEED;  Surgeon: Jovita Kussmaul, MD;  Location: [place];  Service: General;  Laterality: Right;   BREAST LUMPECTOMY WITH RADIOACTIVE SEED LOCALIZATION Left 03/23/2021   Procedure: LEFT BREAST LUMPECTOMY WITH RADIOACTIVE SEED LOCALIZATION;  Surgeon: Jovita Kussmaul, MD;  Location: [facility];  Service: General;  Laterality: Left;   CESAREAN SECTION     X2   CHOLECYSTECTOMY     TONSILLECTOMY      There were no vitals filed for this visit.   Subjective Assessment - 04/17/21 1301     Subjective It was quite easy. Not feeling swollen.  No signs of cording.    Pertinent History 02/08/2021 breast biopsy for Rt  breast cancer which is ER 100%, PR 100%, Ki-67 15%, HER2 equivocal by IHC Rt lumpectomy with SLN 03/23/2021 with 0/7 LN removed. No chemotherapy but will have radiation.  Pt also with non cancerous mass on right breast that will be removed with no lymph node removal    Currently in Pain? Yes    Pain Score 1     Pain Location Axilla    Pain Orientation Right    Pain Descriptors / Indicators Aching    Pain Type Surgical pain    Pain Onset 1 to 4 weeks ago    Pain Frequency Intermittent    Aggravating Factors  reaching                [facility] PT Assessment - 04/17/21 0001       Observation/Other Assessments   Observations Pt is limping from knee injry and has PT pending for this    Quick DASH  4.45      AROM   Right Shoulder Flexion 165 Degrees    Right Shoulder ABduction 175 Degrees    Right Shoulder External Rotation 90 Degrees               LYMPHEDEMA/ONCOLOGY QUESTIONNAIRE - 04/17/21 0001       Right Upper Extremity Lymphedema   10 cm Proximal to Olecranon  Process 27 cm    Olecranon Process 24.5 cm    15 cm Proximal to Ulnar Styloid Process 23.7 cm    Just Proximal to Ulnar Styloid Process 15 cm    Across Hand at [employer] 18 cm    At [place] of 2nd Digit 5.5 cm                Quick Dash - 04/17/21 0001     Open a tight or new jar Mild difficulty    Do heavy household chores (wash walls, wash floors) No difficulty    Carry a shopping bag or briefcase No difficulty    Wash your back No difficulty    Use a knife to cut food No difficulty    Recreational activities in which you take some force or impact through your arm, shoulder, or hand (golf, hammering, tennis) Mild difficulty    During the past week, to what extent has your arm, shoulder or hand problem interfered with your normal social activities with family, friends, neighbors, or groups? Not at all    During the past week, to what extent has your arm, shoulder or hand problem limited your work or  other regular daily activities Not at all    Arm, shoulder, or hand pain. None    Tingling (pins and needles) in your arm, shoulder, or hand None    Difficulty Sleeping No difficulty    DASH Score 4.55 %                             PT Education - 04/17/21 1327     Education Details post op education per instruction section today    Person(s) Educated Patient    Methods Explanation;Demonstration;Handout    Comprehension Verbalized understanding                        Plan - 04/17/21 1333     Clinical Impression Statement Pt is doing very well post operatively.  Return to full AROM without pain or pull.  Incisions well healed, no signs of cording.  Pt was education per instruction section regarding continued care and return to gym activities low and slow as able.  Pt will return for 3 month SOZO.    PT Next Visit Plan 3 month SOZO    Consulted and Agree with Plan of Care Patient             Patient will benefit from skilled therapeutic intervention in order to improve the following deficits and impairments:     Visit Diagnosis: Malignant neoplasm of right breast, stage 1, estrogen receptor positive ([place])  Abnormal posture  Aftercare following surgery for neoplasm     Problem List Patient Active Problem List   Diagnosis Date Noted   Genetic testing 04/03/2021   Family history of breast cancer 03/13/2021   Family history of colon cancer 03/13/2021   Family history of prostate cancer 03/13/2021   Malignant neoplasm of lower-outer quadrant of right breast of female, estrogen receptor positive ([place]) 02/21/2021   Pain in right knee 03/09/2019    Stark Bray, PT 04/17/2021, 1:35 PM  [facility] @ [address] [place], [place], [address] Phone: [phone]   Fax:  [phone]  Name: Veronica Santos MRN: [id number] Date of Birth: 1971-02-11

## 2021-04-18 NOTE — Progress Notes (Signed)
Radiation Oncology         [phone] ________________________________  Name: Veronica Santos MRN: [id number]  Date: 04/19/2021  DOB: 1970-07-03  Follow-Up Visit Note  Outpatient  CC: Gaynelle Arabian, MD  Autumn Messing III, MD  Diagnosis:      ICD-10-CM   1. Malignant neoplasm of lower-outer quadrant of right breast of female, estrogen receptor positive ([place])  C50.511 Pregnancy, urine   Z17.0        Cancer Staging  Malignant neoplasm of lower-outer quadrant of right breast of female, estrogen receptor positive ([place]) Staging form: Breast, AJCC 8th Edition - Clinical stage from 02/22/2021: Stage IA (cT1c, cN0, cM0, G2, ER+, PR+, HER2-) - Signed by Nicholas Lose, MD on 02/22/2021 Stage prefix: Initial diagnosis Histologic grading system: 3 grade system   S/p bilateral lumpectomies: Stage IA (cT1c, cN0, cM0) Right Breast LOQ, Invasive Ductal Carcinoma, ER+ / PR+ / Her2-, Grade 1 pT1a, pN0  CHIEF COMPLAINT: Here to discuss management of right breast cancer  Narrative:  The patient returns today for follow-up.     Since consultation date of 02/22/21, she underwent genetic testing on 03/21/21 which revealed no clinically significant variants detected from BRCAplus testing.  To review: Initial diagnostic imaging showed a mass in the inferolateral right breast, as well as a mass in the left retroareolar breast. (Left breast biopsy performed on 02/08/21 revealed tubular adenoma).  She opted to proceed with bilateral lumpectomies and SNL biopsies on the date of 03/23/21 under Dr. Marlou Starks. Pathology from right lumpectomy revealed: tumor the size of 0.4 cm; histology of invasive ductal carcinoma; margin status to invasive disease of focally less than 0.1 cm from the medial margin, with all margins negative for IDC; nodal status of 7/7 right axillary sentinel lymph node excisions negative for carcinoma;  ER status: 100% positive, PR status 100% positive, both with strong staining intensity; Her2  status negative; Proliferation marker Ki67 at 15%; Grade 1.  Pathology from left lumpectomy revealed intraductal papilloma and a complex sclerosing lesion with usual ductal hyperplasia. No evidence of malignancy was detected.   During follow up with Dr. Marlou Starks on 04/06/21, the patient was noted to be doing well from a post-op standpoint. Physical exam performed indicated no signs of infection or seroma in either breast. The patient will return to Dr. Marlou Starks in 6 months to assess her progress. The patient will also follow up with Dr. Lindi Adie following RT to discuss antiestrogen treatment options.   Symptomatically, the patient reports: doing well. Lymphedema issues, if any:  Patient denies     Pain issues, if any:  Patient denies    SAFETY ISSUES: Prior radiation? No Pacemaker/ICD? No Possible current pregnancy? No--LMP: 04/02/2021 Is the patient on methotrexate? No   Current Complaints / other details: No role of Oncotype DX testing for the tumor that is 0.4 cm grade 1          ALLERGIES:  is allergic to penicillins and sulfa antibiotics.  Meds: Current Outpatient Medications  Medication Sig Dispense Refill   HYDROcodone-acetaminophen (NORCO/VICODIN) 5-325 MG tablet Take 1-2 tablets by mouth every 4 (four) hours as needed for moderate pain or severe pain. 15 tablet 0   meloxicam (MOBIC) 7.5 MG tablet Take 7.5 mg by mouth daily.     metroNIDAZOLE (METROCREAM) 0.75 % cream Apply 1 application topically at bedtime.     Multiple Vitamins-Minerals (MULTIVITAMIN WITH MINERALS) tablet Take 1 tablet by mouth daily.     tamoxifen (NOLVADEX) 20 MG tablet Take 1 tablet (  20 mg total) by mouth daily. 30 tablet 0   No current facility-administered medications for this encounter.    Physical Findings:  height is $RemoveB'5\' 7"'fThnxLee$  (1.702 m) and weight is 159 lb (72.1 kg). Her temperature is 97.7 F (36.5 C). Her blood pressure is 110/74 and her pulse is 87. Her respiration is 18 and oxygen saturation is 99%. .      General: Alert and oriented, in no acute distress Extremities: No cyanosis or edema. Musculoskeletal: good ROM in shoulders. symmetric strength and muscle tone throughout. Neurologic: No obvious focalities. Speech is fluent.  Psychiatric: Judgment and insight are intact. Affect is appropriate. Breast exam reveals healing well from right axillary, right lumpectomy, and left lumpectomy   Lab Findings: Lab Results  Component Value Date   WBC 6.4 03/10/2021   HGB 13.6 03/10/2021   HCT 40.1 03/10/2021   MCV 92.8 03/10/2021   PLT 293 03/10/2021       Radiographic Findings: MM Breast Surgical Specimen  Result Date: 03/23/2021 CLINICAL DATA:  Evaluate right specimen EXAM: SPECIMEN RADIOGRAPH OF THE RIGHT BREAST COMPARISON:  Previous exam(s). FINDINGS: Status post excision of the right breast. The radioactive seed and biopsy marker clip are present, completely intact, and were marked for pathology. IMPRESSION: Specimen radiograph of the right breast. Electronically Signed   By: Dorise Bullion III M.D.   On: 03/23/2021 10:00  MM Breast Surgical Specimen  Result Date: 03/23/2021 CLINICAL DATA:  Evaluate surgical specimen following LEFT breast excision. EXAM: SPECIMEN RADIOGRAPH OF THE LEFT BREAST COMPARISON:  Previous exam(s). FINDINGS: Status post excision of the LEFT breast. Specimen is noted on a grid specimen container. The radioactive seed and CYLINDER clip are present within a separate container. IMPRESSION: Specimen radiograph of the LEFT breast. Electronically Signed   By: Margarette Canada M.D.   On: 03/23/2021 09:12  MM LT RADIOACTIVE SEED LOC MAMMO GUIDE  Result Date: 03/21/2021 CLINICAL DATA:  Patient presents for seed localization of both breasts. 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 MR guided core biopsy of mass in the retroareolar region of the LEFT breast shows a tubular  adenoma with excision recommended. Of note, the cylinder-shaped clip placed at the time of MR guided core biopsy of the LOWER OUTER QUADRANT of the RIGHT breast was noted to have migrated 3.5 centimeters MEDIAL to the biopsy site. EXAM: MAMMOGRAPHIC GUIDED RADIOACTIVE SEED LOCALIZATION OF THE LEFT BREAST MAMMOGRAPHIC GUIDED RADIOACTIVE SEED LOCALIZATION OF THE RIGHT BREAST COMPARISON:  Previous exam(s). FINDINGS: Patient presents for radioactive seed localization prior to excision of both breasts. I met with the patient and we discussed the procedure of seed localization including benefits and alternatives. We discussed the high likelihood of a successful procedure. We discussed the risks of the procedure including infection, bleeding, tissue injury and further surgery. We discussed the low dose of radioactivity involved in the procedure. Informed, written consent was given. The usual time-out protocol was performed immediately prior to the procedure. Site1: LEFT breast Using mammographic guidance, sterile technique, 1% lidocaine and an I-125 radioactive seed, the cylinder-shaped clip in the retroareolar region of the LEFT breast was localized using a LATERAL to me approach. The follow-up mammogram images confirm the seed in the expected location and were marked for Dr. Marlou Starks. Follow-up survey of the patient confirms presence of the radioactive seed. Order number of I-125 seed:  [id number]. Total activity:  6.160 millicuries reference Date: 01/23/2021  Site 2: RIGHT breast Using mammographic guidance, sterile technique, 1% lidocaine and an I-125 radioactive seed, breast tissue 3.5 centimeters LATERAL to the cylinder-shaped clip in the LOWER RIGHT breast was localized using a LATERAL to MEDIAL approach. The follow-up mammogram images confirm the seed in the expected location and were marked for Dr. Marlou Starks. Follow-up survey of the patient confirms presence of the radioactive seed. Order number of I-125 seed:  [id number].  Total activity:  0.881 millicuries reference Date: 12/30/2020 The patient tolerated the procedure well and was released from the [facility]. She was given instructions regarding seed removal. IMPRESSION: Radioactive seed localization of both breasts. No apparent complications. Electronically Signed   By: Nolon Nations M.D.   On: 03/21/2021 14:51  MM RT RADIOACTIVE SEED LOC MAMMO GUIDE  Result Date: 03/21/2021 CLINICAL DATA:  Patient presents for seed localization of both breasts. 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 MR guided core biopsy of mass in the retroareolar region of the LEFT breast shows a tubular adenoma with excision recommended. Of note, the cylinder-shaped clip placed at the time of MR guided core biopsy of the LOWER OUTER QUADRANT of the RIGHT breast was noted to have migrated 3.5 centimeters MEDIAL to the biopsy site. EXAM: MAMMOGRAPHIC GUIDED RADIOACTIVE SEED LOCALIZATION OF THE LEFT BREAST MAMMOGRAPHIC GUIDED RADIOACTIVE SEED LOCALIZATION OF THE RIGHT BREAST COMPARISON:  Previous exam(s). FINDINGS: Patient presents for radioactive seed localization prior to excision of both breasts. I met with the patient and we discussed the procedure of seed localization including benefits and alternatives. We discussed the high likelihood of a successful procedure. We discussed the risks of the procedure including infection, bleeding, tissue injury and further surgery. We discussed the low dose of radioactivity involved in the procedure. Informed, written consent was given. The usual time-out protocol was performed immediately prior to the procedure. Site1: LEFT breast Using mammographic guidance, sterile technique, 1% lidocaine and an I-125 radioactive seed, the cylinder-shaped clip in the retroareolar region of the LEFT breast was localized using a LATERAL to me approach. The follow-up mammogram  images confirm the seed in the expected location and were marked for Dr. Marlou Starks. Follow-up survey of the patient confirms presence of the radioactive seed. Order number of I-125 seed:  [id number]. Total activity:  5.929 millicuries reference Date: 01/23/2021 Site 2: RIGHT breast Using mammographic guidance, sterile technique, 1% lidocaine and an I-125 radioactive seed, breast tissue 3.5 centimeters LATERAL to the cylinder-shaped clip in the LOWER RIGHT breast was localized using a LATERAL to MEDIAL approach. The follow-up mammogram images confirm the seed in the expected location and were marked for Dr. Marlou Starks. Follow-up survey of the patient confirms presence of the radioactive seed. Order number of I-125 seed:  [id number]. Total activity:  1.771 millicuries reference Date: 12/30/2020 The patient tolerated the procedure well and was released from the [facility]. She was given instructions regarding seed removal. IMPRESSION: Radioactive seed localization of both breasts. No apparent complications. Electronically Signed   By: Nolon Nations M.D.   On: 03/21/2021 14:51   Impression/Plan: right breast cancer, stage I , ER+  We discussed adjuvant radiotherapy today.  I recommend radiation therapy to the right breast over 4 weeks in order to reduce risk of locoregional recurrence by 2/3.  I reviewed the logistics, benefits, risks, and potential side effects of this treatment in detail. Risks may include but not necessary be limited to acute and  late injury tissue in the radiation fields such as skin irritation (change in color/pigmentation, itching, dryness, pain, peeling). She may experience fatigue. We also discussed possible risk of long term cosmetic changes or scar tissue. There is also a smaller risk for lung toxicity,  lymphedema, musculoskeletal changes, rib fragility or induction of a second malignancy, late chronic non-healing soft tissue wound.    The patient asked good questions which I answered to her  satisfaction. She is enthusiastic about proceeding with treatment. A consent form has been signed and placed in her chart.  CT simulation next week, treatment start in early Feb 2023.  Urine Preg test ordered for today.  Pt knows to avoid pregnancy during treatment.  On date of service, in total, I spent 30 minutes on this encounter. Patient was seen in person.  _____________________________________   Eppie Gibson, MD  This document serves as a record of services personally performed by Eppie Gibson, MD. It was created on her behalf by Roney Mans, a trained medical scribe. The creation of this record is based on the scribe's personal observations and the provider's statements to them. This document has been checked and approved by the attending provider.

## 2021-04-19 ENCOUNTER — Encounter: Payer: Self-pay | Admitting: Radiation Oncology

## 2021-04-19 ENCOUNTER — Ambulatory Visit
Admission: RE | Admit: 2021-04-19 | Discharge: 2021-04-19 | Disposition: A | Discharge status: Home / Self Care | Payer: 59 | Source: Ambulatory Visit | Attending: Radiation Oncology | Admitting: Radiation Oncology

## 2021-04-19 ENCOUNTER — Other Ambulatory Visit: Payer: Self-pay

## 2021-04-19 VITALS — BP 110/74 | HR 87 | Temp 97.7°F | Resp 18 | Ht 67.0 in | Wt 159.0 lb

## 2021-04-19 DIAGNOSIS — C50511 Malignant neoplasm of lower-outer quadrant of right female breast: Secondary | ICD-10-CM

## 2021-04-19 DIAGNOSIS — Z170 Estrogen receptor positive status [ER+]: Secondary | ICD-10-CM | POA: Insufficient documentation

## 2021-04-19 DIAGNOSIS — Z791 Long term (current) use of non-steroidal anti-inflammatories (NSAID): Secondary | ICD-10-CM | POA: Insufficient documentation

## 2021-04-19 LAB — PREGNANCY, URINE: Preg Test, Ur: NEGATIVE

## 2021-04-19 NOTE — Progress Notes (Signed)
Location of Breast Cancer:  °Malignant neoplasm of lower-outer quadrant of right breast of female, estrogen receptor positive ° °Histology per Pathology Report:  °03/23/2021 °FINAL MICROSCOPIC DIAGNOSIS:  °A. BREAST, LEFT, LUMPECTOMY:  °- Intraductal papilloma.  °- Complex sclerosing lesion with usual ductal hyperplasia.  °- Biopsy site.  °- No malignancy identified.  °B. RADIOACTIVE SEED AND BIOPSY CLIP, REMOVAL:  °- Gross examination:  Radioactive seed and biopsy clip.  °C. BREAST, RIGHT, LUMPECTOMY:  °- Invasive ductal carcinoma.  °- Invasive carcinoma focally less than 0.1 cm from medial margin.  °- Biopsy site reaction.  °- See oncology table and comment.  °D. LYMPH NODE, RIGHT AXILLARY #1, SENTINEL, EXCISION:  °- One lymph node negative for metastatic carcinoma (0/1).  °E. LYMPH NODE, RIGHT AXILLARY, SENTINEL, EXCISION:  °- One lymph node negative for metastatic carcinoma (0/1).  °F. LYMPH NODE, RIGHT AXILLARY, SENTINEL, EXCISION:  °- One lymph node negative for metastatic carcinoma (0/1).  °G. LYMPH NODE, RIGHT AXILLARY, SENTINEL, EXCISION:  °- One lymph node negative for metastatic carcinoma (0/1).  °H. LYMPH NODE, RIGHT AXILLARY, SENTINEL, EXCISION:  °- One lymph node negative for metastatic carcinoma (0/1).  °I. LYMPH NODE, RIGHT AXILLARY #2, SENTINEL, EXCISION:  °- One lymph node negative for metastatic carcinoma (0/1).  °J. LYMPH NODE, RIGHT AXILLARY, SENTINEL, EXCISION:  °- One lymph node negative for metastatic carcinoma (0/1). ° °Receptor Status: ER(100%), PR (100%), Her2-neu (Negative via FISH), Ki-67(15%) ° °Did patient present with symptoms (if so, please note symptoms) or was this found on screening mammography?: 01/27/2021: Patient had a screening MRI for being high risk for breast cancer due to her family history. At that time she was found to have an 11 mm mass in the lower outer quadrant of the right breast. The lymph nodes looked normal ° °Past/Anticipated interventions by surgeon, if any:   °03/23/2021 °--Dr. Paul Toth °RIGHT BREAST LUMPECTOMY WITH RADIOACTIVE SEED LOCALIZATION AND DEEP RIGHT AXILLARY SENTINEL LYMPH NODE BIOPSY °LEFT BREAST LUMPECTOMY WITH RADIOACTIVE SEED LOCALIZATION ° °Past/Anticipated interventions by medical oncology, if any:  °Under care of Dr. Vinay Gudena °04/03/2021 °--Treatment plan: °No role of Oncotype DX testing for the tumor that is 0.4 cm grade 1 °Adjuvant radiation °Followed by adjuvant antiestrogen therapy °--Return to clinic after radiation to discuss antiestrogen therapy °  °Lymphedema issues, if any:  Patient denies   ° °Pain issues, if any:  Patient denies  ° °SAFETY ISSUES: °Prior radiation? No °Pacemaker/ICD? No °Possible current pregnancy? No--LMP: 04/02/2021 °Is the patient on methotrexate? No ° °Current Complaints / other details:  Nothing else of note °   ° ° ° °

## 2021-04-24 ENCOUNTER — Ambulatory Visit
Admission: RE | Admit: 2021-04-24 | Discharge: 2021-04-24 | Disposition: A | Discharge status: Home / Self Care | Payer: 59 | Source: Ambulatory Visit | Attending: Radiation Oncology | Admitting: Radiation Oncology

## 2021-04-24 ENCOUNTER — Other Ambulatory Visit: Payer: Self-pay

## 2021-04-24 DIAGNOSIS — C50511 Malignant neoplasm of lower-outer quadrant of right female breast: Secondary | ICD-10-CM | POA: Diagnosis not present

## 2021-04-25 ENCOUNTER — Encounter: Payer: Self-pay | Admitting: *Deleted

## 2021-04-28 DIAGNOSIS — Z170 Estrogen receptor positive status [ER+]: Secondary | ICD-10-CM | POA: Insufficient documentation

## 2021-04-28 DIAGNOSIS — C50511 Malignant neoplasm of lower-outer quadrant of right female breast: Secondary | ICD-10-CM | POA: Diagnosis not present

## 2021-05-01 ENCOUNTER — Ambulatory Visit
Admission: RE | Admit: 2021-05-01 | Discharge: 2021-05-01 | Disposition: A | Discharge status: Home / Self Care | Payer: 59 | Source: Ambulatory Visit | Attending: Radiation Oncology | Admitting: Radiation Oncology

## 2021-05-01 ENCOUNTER — Other Ambulatory Visit: Payer: Self-pay

## 2021-05-01 DIAGNOSIS — C50511 Malignant neoplasm of lower-outer quadrant of right female breast: Secondary | ICD-10-CM

## 2021-05-01 DIAGNOSIS — Z170 Estrogen receptor positive status [ER+]: Secondary | ICD-10-CM

## 2021-05-01 MED ORDER — RADIAPLEXRX EX GEL: Freq: Once | CUTANEOUS | Status: AC

## 2021-05-01 NOTE — Progress Notes (Signed)
Pt here for patient teaching.    Pt given Radiation and You booklet, skin care instructions, and Radiaplex gel. Alra currently on back-order so advised patient to use an aluminum free/fragrance free deodorant     Reviewed areas of pertinence such as fatigue, hair loss, skin changes, breast tenderness, and breast swelling .   Pt able to give teach back of to pat skin, use unscented/gentle soap, and drink plenty of water,apply Radiaplex bid, avoid applying anything to skin within 4 hours of treatment, avoid wearing an under wire bra, and to use an electric razor if they must shave.   Pt demonstrated understanding and verbalizes understanding of information given and will contact nursing with any questions or concerns.    [email or web address]

## 2021-05-02 ENCOUNTER — Ambulatory Visit
Admission: RE | Admit: 2021-05-02 | Discharge: 2021-05-02 | Disposition: A | Discharge status: Home / Self Care | Payer: 59 | Source: Ambulatory Visit | Attending: Radiation Oncology | Admitting: Radiation Oncology

## 2021-05-02 ENCOUNTER — Telehealth: Payer: Self-pay | Admitting: Hematology and Oncology

## 2021-05-02 DIAGNOSIS — C50511 Malignant neoplasm of lower-outer quadrant of right female breast: Secondary | ICD-10-CM | POA: Diagnosis not present

## 2021-05-02 NOTE — Telephone Encounter (Signed)
Sch per 1/31 inbasket,lef tmsg °

## 2021-05-03 ENCOUNTER — Other Ambulatory Visit: Payer: Self-pay

## 2021-05-03 ENCOUNTER — Ambulatory Visit
Admission: RE | Admit: 2021-05-03 | Discharge: 2021-05-03 | Disposition: A | Discharge status: Home / Self Care | Payer: 59 | Source: Ambulatory Visit | Attending: Radiation Oncology | Admitting: Radiation Oncology

## 2021-05-03 DIAGNOSIS — C50511 Malignant neoplasm of lower-outer quadrant of right female breast: Secondary | ICD-10-CM | POA: Diagnosis not present

## 2021-05-04 ENCOUNTER — Ambulatory Visit
Admission: RE | Admit: 2021-05-04 | Discharge: 2021-05-04 | Disposition: A | Discharge status: Home / Self Care | Payer: 59 | Source: Ambulatory Visit | Attending: Radiation Oncology | Admitting: Radiation Oncology

## 2021-05-04 DIAGNOSIS — C50511 Malignant neoplasm of lower-outer quadrant of right female breast: Secondary | ICD-10-CM | POA: Diagnosis not present

## 2021-05-05 ENCOUNTER — Ambulatory Visit
Admission: RE | Admit: 2021-05-05 | Discharge: 2021-05-05 | Disposition: A | Discharge status: Home / Self Care | Payer: 59 | Source: Ambulatory Visit | Attending: Radiation Oncology | Admitting: Radiation Oncology

## 2021-05-05 DIAGNOSIS — C50511 Malignant neoplasm of lower-outer quadrant of right female breast: Secondary | ICD-10-CM | POA: Diagnosis not present

## 2021-05-08 ENCOUNTER — Ambulatory Visit
Admission: RE | Admit: 2021-05-08 | Discharge: 2021-05-08 | Disposition: A | Discharge status: Home / Self Care | Payer: 59 | Source: Ambulatory Visit | Attending: Radiation Oncology | Admitting: Radiation Oncology

## 2021-05-08 ENCOUNTER — Other Ambulatory Visit: Payer: Self-pay

## 2021-05-08 DIAGNOSIS — C50511 Malignant neoplasm of lower-outer quadrant of right female breast: Secondary | ICD-10-CM | POA: Diagnosis not present

## 2021-05-09 ENCOUNTER — Ambulatory Visit
Admission: RE | Admit: 2021-05-09 | Discharge: 2021-05-09 | Disposition: A | Discharge status: Home / Self Care | Payer: 59 | Source: Ambulatory Visit | Attending: Radiation Oncology | Admitting: Radiation Oncology

## 2021-05-09 ENCOUNTER — Other Ambulatory Visit: Payer: Self-pay

## 2021-05-09 DIAGNOSIS — C50511 Malignant neoplasm of lower-outer quadrant of right female breast: Secondary | ICD-10-CM | POA: Diagnosis not present

## 2021-05-10 ENCOUNTER — Ambulatory Visit
Admission: RE | Admit: 2021-05-10 | Discharge: 2021-05-10 | Disposition: A | Discharge status: Home / Self Care | Payer: 59 | Source: Ambulatory Visit | Attending: Radiation Oncology | Admitting: Radiation Oncology

## 2021-05-10 DIAGNOSIS — C50511 Malignant neoplasm of lower-outer quadrant of right female breast: Secondary | ICD-10-CM | POA: Diagnosis not present

## 2021-05-11 ENCOUNTER — Ambulatory Visit
Admission: RE | Admit: 2021-05-11 | Discharge: 2021-05-11 | Disposition: A | Discharge status: Home / Self Care | Payer: 59 | Source: Ambulatory Visit | Attending: Radiation Oncology | Admitting: Radiation Oncology

## 2021-05-11 ENCOUNTER — Other Ambulatory Visit: Payer: Self-pay

## 2021-05-11 DIAGNOSIS — C50511 Malignant neoplasm of lower-outer quadrant of right female breast: Secondary | ICD-10-CM | POA: Diagnosis not present

## 2021-05-12 ENCOUNTER — Ambulatory Visit
Admission: RE | Admit: 2021-05-12 | Discharge: 2021-05-12 | Disposition: A | Discharge status: Home / Self Care | Payer: 59 | Source: Ambulatory Visit | Attending: Radiation Oncology | Admitting: Radiation Oncology

## 2021-05-12 DIAGNOSIS — C50511 Malignant neoplasm of lower-outer quadrant of right female breast: Secondary | ICD-10-CM | POA: Diagnosis not present

## 2021-05-15 ENCOUNTER — Other Ambulatory Visit: Payer: Self-pay

## 2021-05-15 ENCOUNTER — Ambulatory Visit: Payer: 59 | Admitting: Radiation Oncology

## 2021-05-15 ENCOUNTER — Ambulatory Visit
Admission: RE | Admit: 2021-05-15 | Discharge: 2021-05-15 | Disposition: A | Discharge status: Home / Self Care | Payer: 59 | Source: Ambulatory Visit | Attending: Radiation Oncology | Admitting: Radiation Oncology

## 2021-05-15 DIAGNOSIS — C50511 Malignant neoplasm of lower-outer quadrant of right female breast: Secondary | ICD-10-CM | POA: Diagnosis not present

## 2021-05-16 ENCOUNTER — Ambulatory Visit
Admission: RE | Admit: 2021-05-16 | Discharge: 2021-05-16 | Disposition: A | Discharge status: Home / Self Care | Payer: 59 | Source: Ambulatory Visit | Attending: Radiation Oncology | Admitting: Radiation Oncology

## 2021-05-16 DIAGNOSIS — C50511 Malignant neoplasm of lower-outer quadrant of right female breast: Secondary | ICD-10-CM | POA: Diagnosis not present

## 2021-05-17 ENCOUNTER — Ambulatory Visit
Admission: RE | Admit: 2021-05-17 | Discharge: 2021-05-17 | Disposition: A | Discharge status: Home / Self Care | Payer: 59 | Source: Ambulatory Visit | Attending: Radiation Oncology | Admitting: Radiation Oncology

## 2021-05-17 ENCOUNTER — Other Ambulatory Visit: Payer: Self-pay

## 2021-05-17 DIAGNOSIS — C50511 Malignant neoplasm of lower-outer quadrant of right female breast: Secondary | ICD-10-CM | POA: Diagnosis not present

## 2021-05-18 ENCOUNTER — Ambulatory Visit: Payer: 59

## 2021-05-19 ENCOUNTER — Ambulatory Visit: Payer: 59 | Attending: Hematology and Oncology | Admitting: Rehabilitation

## 2021-05-19 ENCOUNTER — Encounter: Payer: Self-pay | Admitting: Rehabilitation

## 2021-05-19 ENCOUNTER — Other Ambulatory Visit: Payer: Self-pay

## 2021-05-19 ENCOUNTER — Ambulatory Visit
Admission: RE | Admit: 2021-05-19 | Discharge: 2021-05-19 | Disposition: A | Discharge status: Home / Self Care | Payer: 59 | Source: Ambulatory Visit | Attending: Radiation Oncology | Admitting: Radiation Oncology

## 2021-05-19 DIAGNOSIS — C50911 Malignant neoplasm of unspecified site of right female breast: Secondary | ICD-10-CM | POA: Diagnosis present

## 2021-05-19 DIAGNOSIS — Z483 Aftercare following surgery for neoplasm: Secondary | ICD-10-CM | POA: Insufficient documentation

## 2021-05-19 DIAGNOSIS — R293 Abnormal posture: Secondary | ICD-10-CM | POA: Diagnosis present

## 2021-05-19 DIAGNOSIS — Z170 Estrogen receptor positive status [ER+]: Secondary | ICD-10-CM | POA: Insufficient documentation

## 2021-05-19 DIAGNOSIS — C50511 Malignant neoplasm of lower-outer quadrant of right female breast: Secondary | ICD-10-CM | POA: Diagnosis not present

## 2021-05-19 NOTE — Therapy (Signed)
[facility] @ [address] [place], [place], [address] Phone: [phone]   Fax:  [phone]  Physical Therapy Treatment  Patient Details  Name: Veronica Santos MRN: [id number] Date of Birth: 07-06-1970 Referring Provider (PT): Dr. Lindi Adie   Encounter Date: 05/19/2021   PT End of Session - 05/19/21 1040     Visit Number 3    Number of Visits 3    Date for PT Re-Evaluation 05/19/21    PT Start Time 1000    PT Stop Time 1017    PT Time Calculation (min) 32 min    Activity Tolerance Patient tolerated treatment well    Behavior During Therapy [facility] for tasks assessed/performed             Past Medical History:  Diagnosis Date   Cancer [facility])    Family history of breast cancer    Family history of colon cancer    Family history of prostate cancer    Pneumonia     Past Surgical History:  Procedure Laterality Date   AXILLARY SENTINEL NODE BIOPSY Right 03/23/2021   Procedure: RIGHT AXILLARY SENTINEL NODE BIOPSY WITH MAGTRACE INJECTION;  Surgeon: Jovita Kussmaul, MD;  Location: [facility];  Service: General;  Laterality: Right;   BREAST BIOPSY Left 02/08/2021   BREAST BIOPSY Right 02/08/2021   BREAST LUMPECTOMY WITH RADIOACTIVE SEED AND SENTINEL LYMPH NODE BIOPSY Right 03/23/2021   Procedure: RIGHT BREAST LUMPECTOMY WITH RADIOACTIVE SEED;  Surgeon: Jovita Kussmaul, MD;  Location: [facility];  Service: General;  Laterality: Right;   BREAST LUMPECTOMY WITH RADIOACTIVE SEED LOCALIZATION Left 03/23/2021   Procedure: LEFT BREAST LUMPECTOMY WITH RADIOACTIVE SEED LOCALIZATION;  Surgeon: Jovita Kussmaul, MD;  Location: [facility];  Service: General;  Laterality: Left;   CESAREAN SECTION     X2   CHOLECYSTECTOMY     TONSILLECTOMY      There were no vitals filed for this visit.   Subjective Assessment - 05/19/21 0959     Subjective Dr. Isidore Moos said after the first week of radiation I was having some swellingin the breast.  my arm has felt tight a  bit. I am still in radiation and have a rash but not very red.    Pertinent History 02/08/2021 breast biopsy for Rt breast cancer which is ER 100%, PR 100%, Ki-67 15%, HER2 equivocal by IHC Rt lumpectomy with SLN 03/23/2021 with 0/7 LN removed. No chemotherapy but will have radiation.  Pt also with non cancerous mass on right breast that will be removed with no lymph node removal    Currently in Pain? No/denies                [facility] PT Assessment - 05/19/21 0001       Observation/Other Assessments   Observations Rt redness of the breast and rash anterior chest      AROM   Right Shoulder Flexion 165 Degrees    Right Shoulder ABduction 175 Degrees      Palpation   Palpation comment mild fibrosis inferior medial breast and inferior to the lumpectomy incision                L-DEX FLOWSHEETS - 05/19/21 1000       L-DEX LYMPHEDEMA SCREENING   Measurement Type Unilateral    L-DEX MEASUREMENT EXTREMITY Upper Extremity    POSITION  Standing    DOMINANT SIDE Left    At Risk Side Right    BASELINE SCORE (  UNILATERAL) 5.2    L-DEX SCORE (UNILATERAL) 5.9    VALUE CHANGE (UNILAT) 0.7                       [facility] Adult PT Treatment/Exercise - 05/19/21 0001       Manual Therapy   Manual Therapy Manual Lymphatic Drainage (MLD)    Manual Lymphatic Drainage (MLD) education on self breast MLD with initial education on anatomy, physiology, and reasons for MLD.  Then with pt permission performed each step with vcs and then hand over hand instruction with more vcs and tcs as needed.  Pt perofrmed all steps very well.  Clavicular nodes, bil axillary nodes, Rt inguinal nodes, then ant interaxilarry anastamosis and Rt axilloinguinal anastamosis and then Rt breast towards pathways.  Education on how this may have to stop soon due to increasing redness and to focus on hydration and moisturizer until feeling anble to tolerate touch again.  Also discussed when to return 1-2 months post  radiation if it is still present.                     PT Education - 05/19/21 1047     Education Details self MLD Rt breast    Person(s) Educated Patient    Methods Explanation;Demonstration;Tactile cues;Verbal cues;Handout    Comprehension Verbalized understanding;Returned demonstration              PT Short Term Goals - 05/19/21 1047       PT SHORT TERM GOAL #1   Title Pt will be ind with breast MLD    Time 1    Period Days    Status Achieved                      Plan - 05/19/21 1045     Clinical Impression Statement Pt returns to learn self MLD for the Rt breast due to mild fibrosis and edema during radiation.  Also noted some Rt UE tension so we performed SOZO early which was mostly equal to the baseline which pt appreciated hearing.  Pt performed MLD well and will continue this until too tender along with compression bra and knows to return if it remains post radiation.    PT Frequency One time visit    PT Treatment/Interventions ADLs/Self Care Home Management;Therapeutic activities;Therapeutic exercise;Orthotic Fit/Training;Patient/family education;Manual lymph drainage;Manual techniques;Passive range of motion;Compression bandaging;Taping;Scar mobilization    PT Next Visit Plan 3 month SOZO    Consulted and Agree with Plan of Care Patient             Patient will benefit from skilled therapeutic intervention in order to improve the following deficits and impairments:     Visit Diagnosis: Malignant neoplasm of right breast, stage 1, estrogen receptor positive ([place])  Abnormal posture  Aftercare following surgery for neoplasm     Problem List Patient Active Problem List   Diagnosis Date Noted   Genetic testing 04/03/2021   Family history of breast cancer 03/13/2021   Family history of colon cancer 03/13/2021   Family history of prostate cancer 03/13/2021   Malignant neoplasm of lower-outer quadrant of right breast of female,  estrogen receptor positive ([place]) 02/21/2021   Pain in right knee 03/09/2019    Stark Bray, PT 05/19/2021, 10:47 AM  [facility] @ [address] [place], [place], [address] Phone: [phone]   Fax:  [phone]  Name: Sue Fernicola  MRN: [id number] Date of Birth: [birth year]

## 2021-05-19 NOTE — Patient Instructions (Signed)
Manual Lymph Drainage for Right Breast.   Do daily.  Do slowly. Use flat hands with just enough pressure to stretch the skin. Do not slide over the skin, stretch the skin with the hand. (Stretch  Relax  Move) Lie down or sit comfortably (in a recliner, for example) to do this.   Hug yourself:  cross arms and do circles at collar bones near neck 5-7 times (to wake up lots of lymph nodes in this area).  Take slow deep breaths, allowing your belly to balloon out as you breathe in, 5x (to wake up abdominal lymph nodes).  Both armpits--stretch skin in small circles to stimulate intact lymph nodes there, 5-7x.  Right groin area, at panty line--stretch skin in small circles to stimulate lymph nodes 5-7x.  Redirect fluid from right chest toward left armpit (stretch skin starting at right chest in 3-4 spots working toward left armpit) 3-4x across the chest.  Redirect fluid from right armpit toward right groin (cup your hand around the curve of your right side and do 3-4 pumps from armpit to groin) 3-4x down your side.  Draw an imaginary diagonal line from upper outer breast through the nipple area toward lower inner breast.  Direct fluid upward and inward from this line toward the pathway across your upper chest (established in #5).  Do this in three rows to treat all the upper inner breast and do each row 3-4x.  Then direct the fluid down and out from this line toward the pathway down your side going towards the left groin (established in #6). Do this in three rows and do each row 3-4x.   Then repeat your pathways (#5 and #6)  End with repeating #3 and #4 above. (circles in both armpits and the right groin)

## 2021-05-22 ENCOUNTER — Ambulatory Visit: Payer: 59

## 2021-05-22 ENCOUNTER — Ambulatory Visit
Admission: RE | Admit: 2021-05-22 | Discharge: 2021-05-22 | Disposition: A | Discharge status: Home / Self Care | Payer: 59 | Source: Ambulatory Visit | Attending: Radiation Oncology | Admitting: Radiation Oncology

## 2021-05-22 ENCOUNTER — Other Ambulatory Visit: Payer: Self-pay

## 2021-05-22 ENCOUNTER — Other Ambulatory Visit: Payer: Self-pay | Admitting: Radiation Therapy

## 2021-05-22 DIAGNOSIS — G4452 New daily persistent headache (NDPH): Secondary | ICD-10-CM

## 2021-05-22 DIAGNOSIS — C50511 Malignant neoplasm of lower-outer quadrant of right female breast: Secondary | ICD-10-CM | POA: Diagnosis not present

## 2021-05-23 ENCOUNTER — Ambulatory Visit: Payer: 59

## 2021-05-23 ENCOUNTER — Ambulatory Visit
Admission: RE | Admit: 2021-05-23 | Discharge: 2021-05-23 | Disposition: A | Discharge status: Home / Self Care | Payer: 59 | Source: Ambulatory Visit | Attending: Radiation Oncology | Admitting: Radiation Oncology

## 2021-05-23 DIAGNOSIS — C50511 Malignant neoplasm of lower-outer quadrant of right female breast: Secondary | ICD-10-CM | POA: Diagnosis not present

## 2021-05-24 ENCOUNTER — Other Ambulatory Visit: Payer: Self-pay

## 2021-05-24 ENCOUNTER — Ambulatory Visit
Admission: RE | Admit: 2021-05-24 | Discharge: 2021-05-24 | Disposition: A | Discharge status: Home / Self Care | Payer: 59 | Source: Ambulatory Visit | Attending: Radiation Oncology | Admitting: Radiation Oncology

## 2021-05-24 DIAGNOSIS — Z7189 Other specified counseling: Secondary | ICD-10-CM | POA: Diagnosis not present

## 2021-05-24 DIAGNOSIS — Z170 Estrogen receptor positive status [ER+]: Secondary | ICD-10-CM | POA: Diagnosis not present

## 2021-05-24 DIAGNOSIS — C50511 Malignant neoplasm of lower-outer quadrant of right female breast: Secondary | ICD-10-CM | POA: Insufficient documentation

## 2021-05-24 DIAGNOSIS — G4452 New daily persistent headache (NDPH): Secondary | ICD-10-CM

## 2021-05-24 DIAGNOSIS — L598 Other specified disorders of the skin and subcutaneous tissue related to radiation: Secondary | ICD-10-CM | POA: Diagnosis not present

## 2021-05-24 MED ORDER — GADOBENATE DIMEGLUMINE 529 MG/ML IV SOLN
14.0000 mL | Freq: Once | INTRAVENOUS | Status: AC | PRN
  Administered 2021-05-24: 14 mL via INTRAVENOUS

## 2021-05-24 NOTE — Assessment & Plan Note (Signed)
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 ?Pathology counseling: I discussed the final pathology report of the patient provided  a copy of this report. I discussed the margins as well as lymph node surgeries. We also discussed the final staging along with previously performed ER/PR and HER-2/neu testing. ?? ?Treatment plan: ?1.  No role of Oncotype DX testing for the tumor that is 0.4 cm grade 1 ?2. adjuvant radiation completed 05/29/21 ?3.  Followed by adjuvant antiestrogen therapy ?? ?Letrozole counseling: We discussed the risks and benefits of anti-estrogen therapy with aromatase inhibitors. These include but not limited to insomnia, hot flashes, mood changes, vaginal dryness, bone density loss, and weight gain. We strongly believe that the benefits far outweigh the risks. Patient understands these risks and consented to starting treatment. Planned treatment duration is 5-7 years. ? ?RTC in 3 months for SCP visit ?y ?

## 2021-05-24 NOTE — Progress Notes (Signed)
? ?Patient Care Team: ?Gaynelle Arabian, MD as PCP - General (Family Medicine) ?Rockwell Germany, RN as Oncology Nurse Navigator ?Mauro Kaufmann, RN as Oncology Nurse Navigator ?Nicholas Lose, MD as Consulting Physician (Hematology and Oncology) ?Jovita Kussmaul, MD as Consulting Physician (General Surgery) ?Eppie Gibson, MD as Attending Physician (Radiation Oncology) ? ?DIAGNOSIS:  ?  ICD-10-CM   ?1. Malignant neoplasm of lower-outer quadrant of right breast of female, estrogen receptor positive ([place])  C50.511   ? Z17.0   ?  ? ? ?SUMMARY OF ONCOLOGIC HISTORY: ?Oncology History  ?Malignant neoplasm of lower-outer quadrant of right breast of female, estrogen receptor positive ([place])  ?02/21/2021 Initial Diagnosis  ? MRI Breast: a dominant mass in the inferolateral right breast, and a mass in the left retroareolar breast. Biopsy: invasive mammary carcinoma and mammary carcinoma in situ with calcifications in the right breast, and no malignancy in the left breast.  ?  ?02/22/2021 Cancer Staging  ? Staging form: Breast, AJCC 8th Edition ?- Clinical stage from 02/22/2021: Stage IA (cT1c, cN0, cM0, G2, ER+, PR+, HER2-) - Signed by Nicholas Lose, MD on 02/22/2021 ?Stage prefix: Initial diagnosis ?Histologic grading system: 3 grade system ? ?  ? Genetic Testing  ? Negative genetic testing. No pathogenic variants identified on the [facility] CustomNext+RNA panel. The report date is 03/31/2021. ? ?The CustomNext-Cancer + RNAinsight panel includes sequencing and/or deletion duplication testing of the following 47 genes: APC, ATM, AXIN2, BARD1, BMPR1A, BRCA1, BRCA2, BRIP1, CDH1, CDKN2A (p14ARF), CDKN2A (p16INK4a), CKD4, CHEK2, CTNNA1, DICER1, EPCAM (Deletion/duplication testing only), GREM1 (promoter region deletion/duplication testing only), KIT, MEN1, MLH1, MSH2, MSH3, MSH6, MUTYH, NBN, NF1, NHTL1, PALB2, PDGFRA, PMS2, POLD1, POLE, PTEN, RAD50, RAD51C, RAD51D, SDHB, SDHC, SDHD, SMAD4, SMARCA4. STK11, TP53, TSC1, TSC2, and VHL.  The  following genes were evaluated for sequence changes only: SDHA and HOXB13 c.251G>A variant only.  ?  ?03/23/2021 Surgery  ? 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 ?  ? ? ?CHIEF COMPLIANT: Follow-up of breast cancer ? ?INTERVAL HISTORY: Veronica Santos is a 51 y.o. with above-mentioned history of breast cancer having undergone left lumpectomy, currently on radiation therapy. She presents to the clinic today for follow-up.  She is tolerating radiation extremely well without any problems or concerns.  She does have radiation dermatitis.  She is here to discuss antiestrogen therapy. ? ?ALLERGIES:  is allergic to penicillins and sulfa antibiotics. ? ?MEDICATIONS:  ?Current Outpatient Medications  ?Medication Sig Dispense Refill  ? HYDROcodone-acetaminophen (NORCO/VICODIN) 5-325 MG tablet Take 1-2 tablets by mouth every 4 (four) hours as needed for moderate pain or severe pain. 15 tablet 0  ? meloxicam (MOBIC) 7.5 MG tablet Take 7.5 mg by mouth daily.    ? metroNIDAZOLE (METROCREAM) 0.75 % cream Apply 1 application topically at bedtime.    ? Multiple Vitamins-Minerals (MULTIVITAMIN WITH MINERALS) tablet Take 1 tablet by mouth daily.    ? tamoxifen (NOLVADEX) 20 MG tablet Take 1 tablet (20 mg total) by mouth daily. 30 tablet 0  ? ?No current facility-administered medications for this visit.  ? ? ?PHYSICAL EXAMINATION: ?ECOG PERFORMANCE STATUS: 1 - Symptomatic but completely ambulatory ? ?Vitals:  ? 05/25/21 1342  ?BP: 115/73  ?Pulse: 83  ?Resp: 18  ?Temp: (!) 97.3 ?F (36.3 ?C)  ?SpO2: 99%  ? ?Filed Weights  ? 05/25/21 1342  ?Weight: 156 lb (70.8 kg)  ? ? ?BREAST: No  palpable masses or nodules in either right or left breasts. No palpable axillary supraclavicular or infraclavicular adenopathy no breast tenderness or nipple discharge. (exam performed in the presence of a chaperone) ? ?LABORATORY DATA:  ?I  have reviewed the data as listed ?No flowsheet data found. ? ?Lab Results  ?Component Value Date  ? WBC 6.4 03/10/2021  ? HGB 13.6 03/10/2021  ? HCT 40.1 03/10/2021  ? MCV 92.8 03/10/2021  ? PLT 293 03/10/2021  ? ? ?ASSESSMENT & PLAN:  ?Malignant neoplasm of lower-outer quadrant of right breast of female, estrogen receptor positive (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  ?Pathology counseling: I discussed the final pathology report of the patient provided  a copy of this report. I discussed the margins as well as lymph node surgeries. We also discussed the final staging along with previously performed ER/PR and HER-2/neu testing. ?  ?Treatment plan: ?1.  No role of Oncotype DX testing for the tumor that is 0.4 cm grade 1 ?2. adjuvant radiation completed 05/29/21 ?3.  Followed by adjuvant antiestrogen therapy with tamoxifen (patient is still premenopausal) ?  ?Tamoxifen counseling: We discussed the risks and benefits of tamoxifen. These include but not limited to insomnia, hot flashes, mood changes, vaginal dryness, and weight gain. Although rare, serious side effects including endometrial cancer, risk of blood clots were also discussed. We strongly believe that the benefits far outweigh the risks. Patient understands these risks and consented to starting treatment. Planned treatment duration is 5 years. ? ? ?RTC in 3 months for SCP visit ? ? ? ?No orders of the defined types were placed in this encounter. ? ?The patient has a good understanding of the overall plan. she agrees with it. she will call with any problems that may develop before the next visit here. ? ?Total time spent: 20 mins including face to face time and time spent for planning, charting and coordination of care ? ?Rulon Eisenmenger, MD, MPH ?05/25/2021 ? ?I, Thana Ates, am acting as scribe for Dr. Nicholas Lose. ? ?I  have reviewed the above documentation for accuracy and completeness, and I agree with the above. ? ? ? ? ? ? ?

## 2021-05-25 ENCOUNTER — Inpatient Hospital Stay: Payer: 59 | Attending: Hematology and Oncology | Admitting: Hematology and Oncology

## 2021-05-25 ENCOUNTER — Ambulatory Visit
Admission: RE | Admit: 2021-05-25 | Discharge: 2021-05-25 | Disposition: A | Discharge status: Home / Self Care | Payer: 59 | Source: Ambulatory Visit | Attending: Radiation Oncology | Admitting: Radiation Oncology

## 2021-05-25 DIAGNOSIS — Z7189 Other specified counseling: Secondary | ICD-10-CM | POA: Insufficient documentation

## 2021-05-25 DIAGNOSIS — Z170 Estrogen receptor positive status [ER+]: Secondary | ICD-10-CM | POA: Diagnosis not present

## 2021-05-25 DIAGNOSIS — L598 Other specified disorders of the skin and subcutaneous tissue related to radiation: Secondary | ICD-10-CM | POA: Insufficient documentation

## 2021-05-25 DIAGNOSIS — C50511 Malignant neoplasm of lower-outer quadrant of right female breast: Secondary | ICD-10-CM | POA: Diagnosis not present

## 2021-05-25 MED ORDER — TAMOXIFEN CITRATE 20 MG PO TABS: 20.0000 mg | ORAL_TABLET | Freq: Every day | ORAL | 3 refills | Status: DC

## 2021-05-26 ENCOUNTER — Ambulatory Visit: Payer: 59

## 2021-05-26 ENCOUNTER — Telehealth: Payer: Self-pay

## 2021-05-26 ENCOUNTER — Other Ambulatory Visit: Payer: Self-pay

## 2021-05-26 ENCOUNTER — Ambulatory Visit
Admission: RE | Admit: 2021-05-26 | Discharge: 2021-05-26 | Disposition: A | Discharge status: Home / Self Care | Payer: 59 | Source: Ambulatory Visit | Attending: Radiation Oncology | Admitting: Radiation Oncology

## 2021-05-26 DIAGNOSIS — C50511 Malignant neoplasm of lower-outer quadrant of right female breast: Secondary | ICD-10-CM | POA: Diagnosis not present

## 2021-05-26 NOTE — Telephone Encounter (Signed)
-----   Message from Eppie Gibson, MD sent at 05/26/2021  7:23 AM EST ----- ?Please let her know MRI brain shows no disease or abnormality explaining her symptoms that she discussed with me on Monday. Very reassuring. ?Thanks! ? ?----- Message ----- ?From: Interface, Rad Results In ?Sent: 05/25/2021   9:14 AM EST ?To: Eppie Gibson, MD ? ? ?

## 2021-05-26 NOTE — Telephone Encounter (Signed)
Called and spoke with patient to relay reassuring brain MRI results per Dr. Isidore Moos. Patient verbalized understanding and appreciation of call. No other needs identified at this time, but patient knows she can contact me directly if she has any additional questions/concerns. ?

## 2021-05-29 ENCOUNTER — Ambulatory Visit
Admission: RE | Admit: 2021-05-29 | Discharge: 2021-05-29 | Disposition: A | Discharge status: Home / Self Care | Payer: 59 | Source: Ambulatory Visit | Attending: Radiation Oncology | Admitting: Radiation Oncology

## 2021-05-29 ENCOUNTER — Encounter: Payer: Self-pay | Admitting: Radiation Oncology

## 2021-05-29 ENCOUNTER — Other Ambulatory Visit: Payer: Self-pay

## 2021-05-29 DIAGNOSIS — C50511 Malignant neoplasm of lower-outer quadrant of right female breast: Secondary | ICD-10-CM | POA: Diagnosis not present

## 2021-05-31 ENCOUNTER — Encounter (HOSPITAL_COMMUNITY): Payer: Self-pay

## 2021-06-01 ENCOUNTER — Encounter: Payer: Self-pay | Admitting: *Deleted

## 2021-06-01 DIAGNOSIS — Z170 Estrogen receptor positive status [ER+]: Secondary | ICD-10-CM

## 2021-06-05 ENCOUNTER — Ambulatory Visit: Payer: 59

## 2021-06-21 ENCOUNTER — Telehealth: Payer: Self-pay | Admitting: Adult Health

## 2021-06-21 NOTE — Progress Notes (Signed)
? ?                                                                                                                                                          ?  Patient Name: Veronica Santos ?MRN: [id number] ?DOB: 03/24/1971 ?Referring Physician: TOTH III PAUL (Profile Not Attached) ?Date of Service: 05/29/2021 ?[facility] [place] [place] ? ?                                                      End Of Treatment Note ? ?Diagnoses: C50.511-Malignant neoplasm of lower-outer quadrant of right female breast ? ?Cancer Staging:  Cancer Staging  ?Malignant neoplasm of lower-outer quadrant of right breast of female, estrogen receptor positive ([facility]) ?Staging form: Breast, AJCC 8th Edition ?- Clinical stage from 02/22/2021: Stage IA (cT1c, cN0, cM0, G2, ER+, PR+, HER2-) - Signed by Gudena, Vinay, MD on 02/22/2021 ?Stage prefix: Initial diagnosis ?Histologic grading system: 3 grade system ? ?S/p bilateral lumpectomies: Stage IA (cT1c, cN0, cM0) Right Breast LOQ, Invasive Ductal Carcinoma, ER+ / PR+ / Her2-, Grade 1 ?pT1a, pN0 ? ?Intent: Curative ? ?Radiation Treatment Dates: 05/01/2021 through 05/29/2021 ?Site Technique Total Dose (Gy) Dose per Fx (Gy) Completed Fx Beam Energies  ?Breast, Right: Breast_R 3D 40.05/40.05 2.67 15/15 6X  ?Breast, Right: Breast_R_Bst 3D 10/10 2 5/5 6X, 10X  ? ?Narrative: The patient tolerated radiation therapy relatively well.  ? ?Plan: The patient will follow-up with radiation oncology in 1mo. ?----------------------------------- ? ?Sarah Squire, MD ? ? ?

## 2021-06-21 NOTE — Telephone Encounter (Signed)
.  Called patient to schedule appointment per 3/29 inbasket, patient is aware of date and time.   ?

## 2021-06-25 ENCOUNTER — Encounter: Payer: Self-pay | Admitting: Hematology and Oncology

## 2021-07-11 ENCOUNTER — Telehealth: Payer: Self-pay | Admitting: *Deleted

## 2021-07-11 NOTE — Telephone Encounter (Signed)
RETURNED PATIENT'S PHONE CALL TO SEE IF SHE IS INTERESTED IN RESCHEDULING, LVM FOR A RETURN CALL ?

## 2021-07-14 ENCOUNTER — Ambulatory Visit: Payer: Self-pay | Admitting: Radiation Oncology

## 2021-07-31 ENCOUNTER — Ambulatory Visit: Payer: 59 | Attending: Hematology and Oncology

## 2021-07-31 VITALS — Wt 151.5 lb

## 2021-07-31 DIAGNOSIS — Z483 Aftercare following surgery for neoplasm: Secondary | ICD-10-CM | POA: Insufficient documentation

## 2021-07-31 NOTE — Therapy (Signed)
?  OUTPATIENT PHYSICAL THERAPY SOZO SCREENING NOTE ? ? ?Patient Name: Veronica Santos ?MRN: [id number] ?DOB:May 19, 1970, 51 y.o., female ?Today's Date: 07/31/2021 ? ?PCP: Gaynelle Arabian, MD ?REFERRING PROVIDER: Nicholas Lose, MD ? ? PT End of Session - 07/31/21 0830   ? ? Visit Number 3   # unchanged due to screen only  ? PT Start Time 0827   ? PT Stop Time 0834   ? PT Time Calculation (min) 7 min   ? Activity Tolerance Patient tolerated treatment well   ? Behavior During Therapy [facility] for tasks assessed/performed   ? ?  ?  ? ?  ? ? ?Past Medical History:  ?Diagnosis Date  ? Cancer [facility] [place])   ? Family history of breast cancer   ? Family history of colon cancer   ? Family history of prostate cancer   ? Pneumonia   ? ?Past Surgical History:  ?Procedure Laterality Date  ? AXILLARY SENTINEL NODE BIOPSY Right 03/23/2021  ? Procedure: RIGHT AXILLARY SENTINEL NODE BIOPSY WITH MAGTRACE INJECTION;  Surgeon: Jovita Kussmaul, MD;  Location: [place];  Service: General;  Laterality: Right;  ? BREAST BIOPSY Left 02/08/2021  ? BREAST BIOPSY Right 02/08/2021  ? BREAST LUMPECTOMY WITH RADIOACTIVE SEED AND SENTINEL LYMPH NODE BIOPSY Right 03/23/2021  ? Procedure: RIGHT BREAST LUMPECTOMY WITH RADIOACTIVE SEED;  Surgeon: Jovita Kussmaul, MD;  Location: [place];  Service: General;  Laterality: Right;  ? BREAST LUMPECTOMY WITH RADIOACTIVE SEED LOCALIZATION Left 03/23/2021  ? Procedure: LEFT BREAST LUMPECTOMY WITH RADIOACTIVE SEED LOCALIZATION;  Surgeon: Jovita Kussmaul, MD;  Location: [place];  Service: General;  Laterality: Left;  ? CESAREAN SECTION    ? X2  ? CHOLECYSTECTOMY    ? TONSILLECTOMY    ? ?Patient Active Problem List  ? Diagnosis Date Noted  ? Genetic testing 04/03/2021  ? Family history of breast cancer 03/13/2021  ? Family history of colon cancer 03/13/2021  ? Family history of prostate cancer 03/13/2021  ? Malignant neoplasm of lower-outer quadrant of right breast of female, estrogen receptor positive ([place]) 02/21/2021  ? Pain in right knee  03/09/2019  ? ? ?REFERRING DIAG: right breast cancer at risk for lymphedema ? ?THERAPY DIAG:  ?Aftercare following surgery for neoplasm ? ?PERTINENT HISTORY: 02/08/2021 breast biopsy for Rt breast cancer which is ER 100%, PR 100%, Ki-67 15%, HER2 equivocal by IHC Rt lumpectomy with SLN 03/23/2021 with 0/7 LN removed. No chemotherapy but will have radiation.  Pt also with non cancerous mass on right breast that will be removed with no lymph node removal  ? ?PRECAUTIONS: right UE Lymphedema risk, None ? ?SUBJECTIVE: Pt returns for her 3 month L-Dex screen.  ? ?PAIN:  ?Are you having pain? No ? ?SOZO SCREENING: ?Patient was assessed today using the SOZO machine to determine the lymphedema index score. This was compared to her baseline score. It was determined that she is within the recommended range when compared to her baseline and no further action is needed at this time. She will continue SOZO screenings. These are done every 3 months for 2 years post operatively followed by every 6 months for 2 years, and then annually. ? ? ? ?Veronica Santos, PTA ?07/31/2021, 8:33 AM ? ?  ? ?

## 2021-08-29 ENCOUNTER — Telehealth: Payer: Self-pay | Admitting: *Deleted

## 2021-08-31 ENCOUNTER — Other Ambulatory Visit: Payer: Self-pay

## 2021-08-31 ENCOUNTER — Inpatient Hospital Stay: Payer: Managed Care, Other (non HMO) | Attending: Hematology and Oncology | Admitting: Adult Health

## 2021-08-31 ENCOUNTER — Encounter: Payer: Self-pay | Admitting: Adult Health

## 2021-08-31 VITALS — BP 114/77 | HR 82 | Temp 98.1°F | Resp 18 | Ht 67.0 in | Wt 156.8 lb

## 2021-08-31 DIAGNOSIS — N898 Other specified noninflammatory disorders of vagina: Secondary | ICD-10-CM | POA: Insufficient documentation

## 2021-08-31 DIAGNOSIS — C50511 Malignant neoplasm of lower-outer quadrant of right female breast: Secondary | ICD-10-CM | POA: Insufficient documentation

## 2021-08-31 DIAGNOSIS — Z170 Estrogen receptor positive status [ER+]: Secondary | ICD-10-CM | POA: Insufficient documentation

## 2021-08-31 NOTE — Progress Notes (Signed)
SURVIVORSHIP VISIT:  BRIEF ONCOLOGIC HISTORY:  Oncology History  Malignant neoplasm of lower-outer quadrant of right breast of female, estrogen receptor positive ([place])  02/21/2021 Initial Diagnosis   MRI Breast: a dominant mass in the inferolateral right breast, and a mass in the left retroareolar breast. Biopsy: invasive mammary carcinoma and mammary carcinoma in situ with calcifications in the right breast, and no malignancy in the left breast.    02/22/2021 Cancer Staging   Staging form: Breast, AJCC 8th Edition - Clinical stage from 02/22/2021: Stage IA (cT1c, cN0, cM0, G2, ER+, PR+, HER2-) - Signed by Nicholas Lose, MD on 02/22/2021 Stage prefix: Initial diagnosis Histologic grading system: 3 grade system    Genetic Testing   Negative genetic testing. No pathogenic variants identified on the [facility] CustomNext+RNA panel. The report date is 03/31/2021.  The CustomNext-Cancer + RNAinsight panel includes sequencing and/or deletion duplication testing of the following 47 genes: APC, ATM, AXIN2, BARD1, BMPR1A, BRCA1, BRCA2, BRIP1, CDH1, CDKN2A (p14ARF), CDKN2A (p16INK4a), CKD4, CHEK2, CTNNA1, DICER1, EPCAM (Deletion/duplication testing only), GREM1 (promoter region deletion/duplication testing only), KIT, MEN1, MLH1, MSH2, MSH3, MSH6, MUTYH, NBN, NF1, NHTL1, PALB2, PDGFRA, PMS2, POLD1, POLE, PTEN, RAD50, RAD51C, RAD51D, SDHB, SDHC, SDHD, SMAD4, SMARCA4. STK11, TP53, TSC1, TSC2, and VHL.  The following genes were evaluated for sequence changes only: SDHA and HOXB13 c.251G>A variant only.    03/23/2021 Surgery   Left lumpectomy: Intraductal papilloma, CSL and UDH Right lumpectomy: 0.4 cm grade 1 IDC ER 100%, PR 100%, HER2 negative, Ki-67 15%, 0/7 lymph nodes negative    05/01/2021 - 05/29/2021 Radiation Therapy   Site Technique Total Dose (Gy) Dose per Fx (Gy) Completed Fx Beam Energies  Breast, Right: Breast_R 3D 40.05/40.05 2.67 15/15 6X  Breast, Right: Breast_R_Bst 3D 10/10 2 5/5 6X, 10X        INTERVAL HISTORY:  Veronica Santos to review her survivorship care plan detailing her treatment course for breast cancer, as well as monitoring long-term side effects of that treatment, education regarding health maintenance, screening, and overall wellness and health promotion.     Overall, Veronica Santos reports feeling quite well.  She is taking tamoxifen daily and tolerates it moderately well.  Her main issues with taking this include vaginal discharge and decreased libido.  She is otherwise doing well today.    REVIEW OF SYSTEMS:  Review of Systems  Constitutional:  Negative for appetite change, chills, fatigue, fever and unexpected weight change.  HENT:   Negative for hearing loss, lump/mass and trouble swallowing.   Eyes:  Negative for eye problems and icterus.  Respiratory:  Negative for chest tightness, cough and shortness of breath.   Cardiovascular:  Negative for chest pain, leg swelling and palpitations.  Gastrointestinal:  Negative for abdominal distention, abdominal pain, constipation, diarrhea, nausea and vomiting.  Endocrine: Negative for hot flashes.  Genitourinary:  Negative for difficulty urinating.   Musculoskeletal:  Negative for arthralgias.  Skin:  Negative for itching and rash.  Neurological:  Negative for dizziness, extremity weakness, headaches and numbness.  Hematological:  Negative for adenopathy. Does not bruise/bleed easily.  Psychiatric/Behavioral:  Negative for depression. The patient is not nervous/anxious.   Breast: Denies any new nodularity, masses, tenderness, nipple changes, or nipple discharge.   ONCOLOGY TREATMENT TEAM:  1. Surgeon:  Dr. Marlou Starks at [facility] 2. Medical Oncologist: Dr. Lindi Adie  3. Radiation Oncologist: Dr. Isidore Moos    PAST MEDICAL/SURGICAL HISTORY:  Past Medical History:  Diagnosis Date   Cancer [facility])    Family history of  breast cancer    Family history of colon cancer    Family history of prostate cancer    Pneumonia     Past Surgical History:  Procedure Laterality Date   AXILLARY SENTINEL NODE BIOPSY Right 03/23/2021   Procedure: RIGHT AXILLARY SENTINEL NODE BIOPSY WITH MAGTRACE INJECTION;  Surgeon: Jovita Kussmaul, MD;  Location: [place];  Service: General;  Laterality: Right;   BREAST BIOPSY Left 02/08/2021   BREAST BIOPSY Right 02/08/2021   BREAST LUMPECTOMY WITH RADIOACTIVE SEED AND SENTINEL LYMPH NODE BIOPSY Right 03/23/2021   Procedure: RIGHT BREAST LUMPECTOMY WITH RADIOACTIVE SEED;  Surgeon: Jovita Kussmaul, MD;  Location: [place];  Service: General;  Laterality: Right;   BREAST LUMPECTOMY WITH RADIOACTIVE SEED LOCALIZATION Left 03/23/2021   Procedure: LEFT BREAST LUMPECTOMY WITH RADIOACTIVE SEED LOCALIZATION;  Surgeon: Jovita Kussmaul, MD;  Location: [place];  Service: General;  Laterality: Left;   CESAREAN SECTION     X2   CHOLECYSTECTOMY     TONSILLECTOMY       ALLERGIES:  Allergies  Allergen Reactions   Penicillins Hives   Sulfa Antibiotics Hives     CURRENT MEDICATIONS:  Outpatient Encounter Medications as of 08/31/2021  Medication Sig   HYDROcodone-acetaminophen (NORCO/VICODIN) 5-325 MG tablet Take 1-2 tablets by mouth every 4 (four) hours as needed for moderate pain or severe pain.   meloxicam (MOBIC) 7.5 MG tablet Take 7.5 mg by mouth daily.   metroNIDAZOLE (METROCREAM) 0.75 % cream Apply 1 application topically at bedtime.   Multiple Vitamins-Minerals (MULTIVITAMIN WITH MINERALS) tablet Take 1 tablet by mouth daily.   tamoxifen (NOLVADEX) 20 MG tablet Take 1 tablet (20 mg total) by mouth daily.   No facility-administered encounter medications on file as of 08/31/2021.     ONCOLOGIC FAMILY HISTORY:  Family History  Problem Relation Age of Onset   Colon cancer Father 74   Prostate cancer Paternal Uncle 10   Breast cancer Paternal Grandmother        dx 5s     GENETIC COUNSELING/TESTING: See above  SOCIAL HISTORY:  Social History   Socioeconomic History   Marital status:  Married    Spouse name: Not on file   Number of children: Not on file   Years of education: Not on file   Highest education level: Not on file  Occupational History   Not on file  Tobacco Use   Smoking status: Never   Smokeless tobacco: Never  Vaping Use   Vaping Use: Never used  Substance and Sexual Activity   Alcohol use: Yes    Comment: socially   Drug use: Never   Sexual activity: Yes  Other Topics Concern   Not on file  Social History Narrative   Not on file   Social Determinants of Health   Financial Resource Strain: Not on file  Food Insecurity: Not on file  Transportation Needs: Not on file  Physical Activity: Not on file  Stress: Not on file  Social Connections: Not on file  Intimate Partner Violence: Not on file     OBSERVATIONS/OBJECTIVE:  BP 114/77 (BP Location: Left Arm, Patient Position: Sitting)   Pulse 82   Temp 98.1 F (36.7 C) (Temporal)   Resp 18   Ht $R'5\' 7"'jt$  (1.702 m)   Wt 156 lb 12.8 oz (71.1 kg)   SpO2 98%   BMI 24.56 kg/m  GENERAL: Patient is a well appearing female in no acute distress HEENT:  Sclerae anicteric.  Oropharynx clear and moist.  No ulcerations or evidence of oropharyngeal candidiasis. Neck is supple.  NODES:  No cervical, supraclavicular, or axillary lymphadenopathy palpated.  BREAST EXAM:  right breast s/p lumpectomy and radiation, no sign of local recurrence, left breast is benign LUNGS:  Clear to auscultation bilaterally.  No wheezes or rhonchi. HEART:  Regular rate and rhythm. No murmur appreciated. ABDOMEN:  Soft, nontender.  Positive, normoactive bowel sounds. No organomegaly palpated. MSK:  No focal spinal tenderness to palpation. Full range of motion bilaterally in the upper extremities. EXTREMITIES:  No peripheral edema.   SKIN:  Clear with no obvious rashes or skin changes. No nail dyscrasia. NEURO:  Nonfocal. Well oriented.  Appropriate affect.   LABORATORY DATA:  None for this visit.  DIAGNOSTIC IMAGING:  None  for this visit.      ASSESSMENT AND PLAN:  Ms.. Santos is a pleasant 51 y.o. female with Stage IA right breast invasive ductal carcinoma, ER+/PR+/HER2-, diagnosed in 01/2021, treated with lumpectomy, adjuvant radiation therapy, and anti-estrogen therapy with Tamoxifen beginning in 05/2021.  She presents to the Survivorship Clinic for our initial meeting and routine follow-up post-completion of treatment for breast cancer.    1. Stage IA right breast cancer:  Veronica Santos is continuing to recover from definitive treatment for breast cancer. She will follow-up with her medical oncologist, Dr. Lindi Adie in 6 months with history and physical exam per surveillance protocol.  She will continue her anti-estrogen therapy with Tamoxifen. Thus far, she is tolerating the Tamoxifen well, with vaginal side effects I gave her an informational packet about.   Her mammogram is due 01/2022; orders placed today.  Breast MRI due 07/2022 because her breast cancer was mammographically occult. Today, a comprehensive survivorship care plan and treatment summary was reviewed with the patient today detailing her breast cancer diagnosis, treatment course, potential late/long-term effects of treatment, appropriate follow-up care with recommendations for the future, and patient education resources.  A copy of this summary, along with a letter will be sent to the patient's primary care provider via mail/fax/In Basket message after today's visit.    2. Bone health:  She was given education on specific activities to promote bone health.  3. Cancer screening:  Due to Veronica Santos history and her age, she should receive screening for skin cancers, colon cancer, and gynecologic cancers.  The information and recommendations are listed on the patient's comprehensive care plan/treatment summary and were reviewed in detail with the patient.    4. Health maintenance and wellness promotion: Veronica Santos was encouraged to consume 5-7 servings of  fruits and vegetables per day. We reviewed the "Nutrition Rainbow" handout.  She was also encouraged to engage in moderate to vigorous exercise for 30 minutes per day most days of the week. We discussed the [employer] fitness program, which is designed for cancer survivors to help them become more physically fit after cancer treatments.  She was instructed to limit her alcohol consumption and continue to abstain from tobacco use.     5. Support services/counseling: It is not uncommon for this period of the patient's cancer care trajectory to be one of many emotions and stressors.  She was given information regarding our available services and encouraged to contact me with any questions or for help enrolling in any of our support group/programs.    Follow up instructions:    -Return to cancer center in 6 months for f/u with Dr. Lindi Adie  -Mammogram due in 01/2022 -Breast MRI 07/2022 -Follow up with surgery 08/2022 -She  is welcome to return back to the Survivorship Clinic at any time; no additional follow-up needed at this time.  -Consider referral back to survivorship as a long-term survivor for continued surveillance  The patient was provided an opportunity to ask questions and all were answered. The patient agreed with the plan and demonstrated an understanding of the instructions.   Total encounter time:45 minutes*in face-to-face visit time, chart review, lab review, care coordination, order entry, and documentation of the encounter time.  Wilber Bihari, NP 08/31/21 9:33 AM Medical Oncology and Hematology [facility] [place], [place] [address] Tel. [phone]    Fax. [phone]  *Total Encounter Time as defined by the Centers for Medicare and Medicaid Services includes, in addition to the face-to-face time of a patient visit (documented in the note above) non-face-to-face time: obtaining and reviewing outside history, ordering and reviewing medications,  tests or procedures, care coordination (communications with other health care professionals or caregivers) and documentation in the medical record.

## 2021-09-01 ENCOUNTER — Telehealth: Payer: Self-pay | Admitting: Hematology and Oncology

## 2021-09-01 NOTE — Telephone Encounter (Signed)
Scheduled appointment per 06/08  los. Left message.

## 2021-09-13 ENCOUNTER — Encounter: Payer: Self-pay | Admitting: Adult Health

## 2021-09-14 ENCOUNTER — Telehealth: Payer: Self-pay

## 2021-09-14 NOTE — Telephone Encounter (Signed)
Called pt to f/u with mychart message regarding rt breast change.  Pt denies any redness, warmth and pain.  Pt states intermittent sensations of rt breast when not touching and raised veins that is the same color as the skin.   Informed pt that if any redness, tenderness, or pain occurs to call our office back, otherwise advised pt to come into clinic to be reevaluated. Scheduled pt for 09/19/21 @ 2:15. Pt verbalized understanding and thanks.

## 2021-09-19 ENCOUNTER — Inpatient Hospital Stay: Payer: Managed Care, Other (non HMO) | Admitting: Adult Health

## 2021-12-04 ENCOUNTER — Encounter: Payer: Self-pay | Admitting: Adult Health

## 2021-12-04 ENCOUNTER — Ambulatory Visit: Payer: 59 | Attending: Hematology and Oncology

## 2021-12-04 VITALS — Wt 157.4 lb

## 2021-12-04 DIAGNOSIS — Z483 Aftercare following surgery for neoplasm: Secondary | ICD-10-CM | POA: Insufficient documentation

## 2021-12-04 NOTE — Therapy (Signed)
  OUTPATIENT PHYSICAL THERAPY SOZO SCREENING NOTE   Patient Name: Veronica Santos MRN: [id number] DOB:1970/06/13, 51 y.o., female Today's Date: 12/04/2021  PCP: Gaynelle Arabian, MD REFERRING PROVIDER: Nicholas Lose, MD   PT End of Session - 12/04/21 1020     Visit Number 3   # unchanged due to screen only   PT Start Time 16    PT Stop Time 1022    PT Time Calculation (min) 4 min    Activity Tolerance Patient tolerated treatment well    Behavior During Therapy WFL for tasks assessed/performed             Past Medical History:  Diagnosis Date   Cancer ([place])    Family history of breast cancer    Family history of colon cancer    Family history of prostate cancer    Pneumonia    Past Surgical History:  Procedure Laterality Date   AXILLARY SENTINEL NODE BIOPSY Right 03/23/2021   Procedure: RIGHT AXILLARY SENTINEL NODE BIOPSY WITH MAGTRACE INJECTION;  Surgeon: Jovita Kussmaul, MD;  Location: [place];  Service: General;  Laterality: Right;   BREAST BIOPSY Left 02/08/2021   BREAST BIOPSY Right 02/08/2021   BREAST LUMPECTOMY WITH RADIOACTIVE SEED AND SENTINEL LYMPH NODE BIOPSY Right 03/23/2021   Procedure: RIGHT BREAST LUMPECTOMY WITH RADIOACTIVE SEED;  Surgeon: Jovita Kussmaul, MD;  Location: [place];  Service: General;  Laterality: Right;   BREAST LUMPECTOMY WITH RADIOACTIVE SEED LOCALIZATION Left 03/23/2021   Procedure: LEFT BREAST LUMPECTOMY WITH RADIOACTIVE SEED LOCALIZATION;  Surgeon: Jovita Kussmaul, MD;  Location: [place];  Service: General;  Laterality: Left;   CESAREAN SECTION     X2   CHOLECYSTECTOMY     TONSILLECTOMY     Patient Active Problem List   Diagnosis Date Noted   Genetic testing 04/03/2021   Family history of breast cancer 03/13/2021   Family history of colon cancer 03/13/2021   Family history of prostate cancer 03/13/2021   Malignant neoplasm of lower-outer quadrant of right breast of female, estrogen receptor positive ([place]) 02/21/2021   Pain in right knee  03/09/2019    REFERRING DIAG: right breast cancer at risk for lymphedema  THERAPY DIAG: Aftercare following surgery for neoplasm  PERTINENT HISTORY: 02/08/2021 breast biopsy for Rt breast cancer which is ER 100%, PR 100%, Ki-67 15%, HER2 equivocal by IHC Rt lumpectomy with SLN 03/23/2021 with 0/7 LN removed. No chemotherapy but will have radiation.  Pt also with non cancerous mass on right breast that will be removed with no lymph node removal   PRECAUTIONS: right UE Lymphedema risk, None  SUBJECTIVE: Pt returns for her 3 month L-Dex screen.   PAIN:  Are you having pain? No  SOZO SCREENING: Patient was assessed today using the SOZO machine to determine the lymphedema index score. This was compared to her baseline score. It was determined that she is within the recommended range when compared to her baseline and no further action is needed at this time. She will continue SOZO screenings. These are done every 3 months for 2 years post operatively followed by every 6 months for 2 years, and then annually.    [name], PTA 12/04/2021, 10:21 AM

## 2021-12-24 ENCOUNTER — Encounter: Payer: Self-pay | Admitting: Adult Health

## 2022-02-20 ENCOUNTER — Ambulatory Visit
Admission: RE | Admit: 2022-02-20 | Discharge: 2022-02-20 | Disposition: A | Discharge status: Home / Self Care | Payer: 59 | Source: Ambulatory Visit | Attending: Adult Health | Admitting: Adult Health

## 2022-02-20 ENCOUNTER — Other Ambulatory Visit: Payer: Self-pay | Admitting: Adult Health

## 2022-02-20 ENCOUNTER — Encounter: Payer: Self-pay | Admitting: Adult Health

## 2022-02-20 DIAGNOSIS — Z170 Estrogen receptor positive status [ER+]: Secondary | ICD-10-CM

## 2022-02-20 HISTORY — DX: Personal history of irradiation: Z92.3

## 2022-02-20 HISTORY — DX: Malignant neoplasm of unspecified site of unspecified female breast: C50.919

## 2022-02-24 NOTE — Progress Notes (Signed)
Patient Care Team: Gaynelle Arabian, MD as PCP - General (Family Medicine) Nicholas Lose, MD as Consulting Physician (Hematology and Oncology) Jovita Kussmaul, MD as Consulting Physician (General Surgery) Eppie Gibson, MD as Attending Physician (Radiation Oncology)  DIAGNOSIS:  Encounter Diagnosis  Name Primary?   Malignant neoplasm of lower-outer quadrant of right breast of female, estrogen receptor positive ([place]) Yes    SUMMARY OF ONCOLOGIC HISTORY: Oncology History  Malignant neoplasm of lower-outer quadrant of right breast of female, estrogen receptor positive ([place])  02/21/2021 Initial Diagnosis   MRI Breast: a dominant mass in the inferolateral right breast, and a mass in the left retroareolar breast. Biopsy: invasive mammary carcinoma and mammary carcinoma in situ with calcifications in the right breast, and no malignancy in the left breast.    02/22/2021 Cancer Staging   Staging form: Breast, AJCC 8th Edition - Clinical stage from 02/22/2021: Stage IA (cT1c, cN0, cM0, G2, ER+, PR+, HER2-) - Signed by Nicholas Lose, MD on 02/22/2021 Stage prefix: Initial diagnosis Histologic grading system: 3 grade system    Genetic Testing   Negative genetic testing. No pathogenic variants identified on the [facility] CustomNext+RNA panel. The report date is 03/31/2021.  The CustomNext-Cancer + RNAinsight panel includes sequencing and/or deletion duplication testing of the following 47 genes: APC, ATM, AXIN2, BARD1, BMPR1A, BRCA1, BRCA2, BRIP1, CDH1, CDKN2A (p14ARF), CDKN2A (p16INK4a), CKD4, CHEK2, CTNNA1, DICER1, EPCAM (Deletion/duplication testing only), GREM1 (promoter region deletion/duplication testing only), KIT, MEN1, MLH1, MSH2, MSH3, MSH6, MUTYH, NBN, NF1, NHTL1, PALB2, PDGFRA, PMS2, POLD1, POLE, PTEN, RAD50, RAD51C, RAD51D, SDHB, SDHC, SDHD, SMAD4, SMARCA4. STK11, TP53, TSC1, TSC2, and VHL.  The following genes were evaluated for sequence changes only: SDHA and HOXB13 c.251G>A variant only.     03/23/2021 Surgery   Left lumpectomy: Intraductal papilloma, CSL and UDH Right lumpectomy: 0.4 cm grade 1 IDC ER 100%, PR 100%, HER2 negative, Ki-67 15%, 0/7 lymph nodes negative    05/01/2021 - 05/29/2021 Radiation Therapy   Site Technique Total Dose (Gy) Dose per Fx (Gy) Completed Fx Beam Energies  Breast, Right: Breast_R 3D 40.05/40.05 2.67 15/15 6X  Breast, Right: Breast_R_Bst 3D 10/10 2 5/5 6X, 10X       CHIEF COMPLIANT: Left breast cancer surveillance on tamoxifen  INTERVAL HISTORY: Veronica Santos is a 51 year old above-mentioned history of left breast cancer who underwent lumpectomy and is here today for a follow-up. She states that she does have joint pain in her hands all day. She some days it's worst than others. But overall she is tolerating it extremely well.   ALLERGIES:  is allergic to penicillins, other, and sulfa antibiotics.  MEDICATIONS:  Current Outpatient Medications  Medication Sig Dispense Refill   Multiple Vitamins-Minerals (MULTIVITAMIN WITH MINERALS) tablet Take 1 tablet by mouth daily.     tamoxifen (NOLVADEX) 20 MG tablet Take 1 tablet (20 mg total) by mouth daily. 90 tablet 3   No current facility-administered medications for this visit.    PHYSICAL EXAMINATION: ECOG PERFORMANCE STATUS: 1 - Symptomatic but completely ambulatory  Vitals:   03/02/22 1113  BP: (!) 139/91  Pulse: 78  Resp: 16  Temp: 98.1 F (36.7 C)  SpO2: 99%   Filed Weights   03/02/22 1113  Weight: 155 lb 1.6 oz (70.4 kg)      LABORATORY DATA:  I have reviewed the data as listed     No data to display          Lab Results  Component Value Date   WBC 6.4  03/10/2021   HGB 13.6 03/10/2021   HCT 40.1 03/10/2021   MCV 92.8 03/10/2021   PLT 293 03/10/2021    ASSESSMENT & PLAN:  Malignant neoplasm of lower-outer quadrant of right breast of female, estrogen receptor positive ([facility]) 03/23/2021:Left lumpectomy: Intraductal papilloma, CSL and UDH Right lumpectomy: 0.4 cm  grade 1 IDC ER 100%, PR 100%, HER2 negative, Ki-67 15%, 0/7 lymph nodes negative   Treatment plan: 1.  No role of Oncotype DX testing for the tumor that is 0.4 cm grade 1 2. adjuvant radiation completed 05/29/21 3.  Followed by adjuvant antiestrogen therapy with tamoxifen (patient is still premenopausal) started 05/25/2021   Tamoxifen toxicities: Initially she had hot flashes but eventually these have subsided and she does not seem to have any side effects.  She is still premenopausal she had her last menstrual cycle about a month ago.  Breast cancer surveillance: Breast exam 03/02/2022: Benign Mammogram 02/20/2022: Benign breast density category C  Return to clinic in 1 year for follow-up    No orders of the defined types were placed in this encounter.  The patient has a good understanding of the overall plan. she agrees with it. she will call with any problems that may develop before the next visit here. Total time spent: 30 mins including face to face time and time spent for planning, charting and co-ordination of care   Harriette Ohara, MD 03/02/22    I Gardiner Coins am scribing for Dr. Lindi Adie  I have reviewed the above documentation for accuracy and completeness, and I agree with the above.

## 2022-03-02 ENCOUNTER — Inpatient Hospital Stay: Payer: 59 | Attending: Hematology and Oncology | Admitting: Hematology and Oncology

## 2022-03-02 ENCOUNTER — Other Ambulatory Visit: Payer: Self-pay

## 2022-03-02 VITALS — BP 139/91 | HR 78 | Temp 98.1°F | Resp 16 | Wt 155.1 lb

## 2022-03-02 DIAGNOSIS — Z170 Estrogen receptor positive status [ER+]: Secondary | ICD-10-CM | POA: Diagnosis not present

## 2022-03-02 DIAGNOSIS — Z923 Personal history of irradiation: Secondary | ICD-10-CM | POA: Diagnosis not present

## 2022-03-02 DIAGNOSIS — C50511 Malignant neoplasm of lower-outer quadrant of right female breast: Secondary | ICD-10-CM | POA: Diagnosis present

## 2022-03-02 DIAGNOSIS — Z79810 Long term (current) use of selective estrogen receptor modulators (SERMs): Secondary | ICD-10-CM | POA: Diagnosis not present

## 2022-03-02 MED ORDER — TAMOXIFEN CITRATE 20 MG PO TABS: 20.0000 mg | ORAL_TABLET | Freq: Every day | ORAL | 3 refills | Status: DC

## 2022-03-02 NOTE — Assessment & Plan Note (Signed)
03/23/2021:Left lumpectomy: Intraductal papilloma, CSL and UDH Right lumpectomy: 0.4 cm grade 1 IDC ER 100%, PR 100%, HER2 negative, Ki-67 15%, 0/7 lymph nodes negative   Treatment plan: 1.  No role of Oncotype DX testing for the tumor that is 0.4 cm grade 1 2. adjuvant radiation completed 05/29/21 3.  Followed by adjuvant antiestrogen therapy with tamoxifen (patient is still premenopausal) started 05/25/2021   Tamoxifen toxicities:  Breast cancer surveillance: Breast exam 03/02/2022: Benign Mammogram 02/20/2022: Benign breast density category C  Return to clinic in 1 year for follow-up

## 2022-03-05 ENCOUNTER — Ambulatory Visit: Payer: 59 | Attending: Hematology and Oncology

## 2022-03-05 VITALS — Wt 157.4 lb

## 2022-03-05 DIAGNOSIS — Z483 Aftercare following surgery for neoplasm: Secondary | ICD-10-CM | POA: Insufficient documentation

## 2022-03-05 NOTE — Therapy (Signed)
OUTPATIENT PHYSICAL THERAPY SOZO SCREENING NOTE   Patient Name: Veronica Santos MRN: [id number] DOB:May 01, 1970, 51 y.o., female Today's Date: 03/05/2022  PCP: Gaynelle Arabian, MD REFERRING PROVIDER: Gaynelle Arabian, MD   PT End of Session - 03/05/22 1603     Visit Number 3   # unchanged due to screen only   PT Start Time 1600    PT Stop Time 1605    PT Time Calculation (min) 5 min    Activity Tolerance Patient tolerated treatment well    Behavior During Therapy WFL for tasks assessed/performed             Past Medical History:  Diagnosis Date   Breast cancer ([place])    Cancer ([place])    Family history of breast cancer    Family history of colon cancer    Family history of prostate cancer    Personal history of radiation therapy    Pneumonia    Past Surgical History:  Procedure Laterality Date   AXILLARY SENTINEL NODE BIOPSY Right 03/23/2021   Procedure: RIGHT AXILLARY SENTINEL NODE BIOPSY WITH MAGTRACE INJECTION;  Surgeon: Jovita Kussmaul, MD;  Location: [place];  Service: General;  Laterality: Right;   BREAST BIOPSY Left 02/08/2021   BREAST BIOPSY Right 02/08/2021   BREAST EXCISIONAL BIOPSY     BREAST LUMPECTOMY     BREAST LUMPECTOMY WITH RADIOACTIVE SEED AND SENTINEL LYMPH NODE BIOPSY Right 03/23/2021   Procedure: RIGHT BREAST LUMPECTOMY WITH RADIOACTIVE SEED;  Surgeon: Jovita Kussmaul, MD;  Location: [place];  Service: General;  Laterality: Right;   BREAST LUMPECTOMY WITH RADIOACTIVE SEED LOCALIZATION Left 03/23/2021   Procedure: LEFT BREAST LUMPECTOMY WITH RADIOACTIVE SEED LOCALIZATION;  Surgeon: Jovita Kussmaul, MD;  Location: [place];  Service: General;  Laterality: Left;   CESAREAN SECTION     X2   CHOLECYSTECTOMY     TONSILLECTOMY     Patient Active Problem List   Diagnosis Date Noted   Genetic testing 04/03/2021   Family history of breast cancer 03/13/2021   Family history of colon cancer 03/13/2021   Family history of prostate cancer 03/13/2021   Malignant  neoplasm of lower-outer quadrant of right breast of female, estrogen receptor positive ([place]) 02/21/2021   Pain in right knee 03/09/2019    REFERRING DIAG: right breast cancer at risk for lymphedema  THERAPY DIAG: Aftercare following surgery for neoplasm  PERTINENT HISTORY: 02/08/2021 breast biopsy for Rt breast cancer which is ER 100%, PR 100%, Ki-67 15%, HER2 equivocal by IHC Rt lumpectomy with SLN 03/23/2021 with 0/7 LN removed. No chemotherapy but will have radiation.  Pt also with non cancerous mass on right breast that will be removed with no lymph node removal   PRECAUTIONS: right UE Lymphedema risk, None  SUBJECTIVE: Pt returns for her 3 month L-Dex screen.   PAIN:  Are you having pain? No  SOZO SCREENING: Patient was assessed today using the SOZO machine to determine the lymphedema index score. This was compared to her baseline score. It was determined that she is within the recommended range when compared to her baseline and no further action is needed at this time. She will continue SOZO screenings. These are done every 3 months for 2 years post operatively followed by every 6 months for 2 years, and then annually.   L-DEX FLOWSHEETS - 03/05/22 1600       L-DEX LYMPHEDEMA SCREENING   Measurement Type Unilateral    L-DEX MEASUREMENT EXTREMITY Upper Extremity    POSITION  Standing    DOMINANT SIDE Left    At Risk Side Right    BASELINE SCORE (UNILATERAL) 5.2    L-DEX SCORE (UNILATERAL) 4.7    VALUE CHANGE (UNILAT) -0.5              [name], PTA 03/05/2022, 4:04 PM

## 2022-05-17 ENCOUNTER — Ambulatory Visit: Payer: Self-pay | Admitting: Podiatry

## 2022-05-23 ENCOUNTER — Ambulatory Visit: Payer: Self-pay | Admitting: Podiatry

## 2022-06-06 ENCOUNTER — Ambulatory Visit: Payer: Managed Care, Other (non HMO) | Admitting: Podiatry

## 2022-06-06 DIAGNOSIS — S91201A Unspecified open wound of right great toe with damage to nail, initial encounter: Secondary | ICD-10-CM

## 2022-06-06 DIAGNOSIS — S91209A Unspecified open wound of unspecified toe(s) with damage to nail, initial encounter: Secondary | ICD-10-CM

## 2022-06-06 DIAGNOSIS — M722 Plantar fascial fibromatosis: Secondary | ICD-10-CM | POA: Diagnosis not present

## 2022-06-06 MED ORDER — MELOXICAM 15 MG PO TABS: 15.0000 mg | ORAL_TABLET | Freq: Every day | ORAL | 3 refills | Status: DC

## 2022-06-06 NOTE — Progress Notes (Signed)
  Subjective:  Patient ID: Veronica Santos, female    DOB: 08-08-1970,  MRN: [id number]  Chief Complaint  Patient presents with   Nail Problem    Left Foot pain and big toe nail injury on right foot    52 y.o. female presents with the above complaint. History confirmed with patient.  She presents with a new issue she injured her right great toenail with her back stand recently and nearly pulled the toenail off it is still attached been very painful red and swollen.  She is also dealing with new heel pain for few weeks now on the bottom of the left heel  Objective:  Physical Exam: warm, good capillary refill, no trophic changes or ulcerative lesions, normal DP and PT pulses, normal sensory exam Left Foot: point tenderness over the heel pad Right Foot:  Partial traumatic nail avulsion, no signs of infection, nail is attached laterally and proximally  Assessment:   1. Nail avulsion of toe, initial encounter   2. Plantar fasciitis of left foot      Plan:  Patient was evaluated and treated and all questions answered.  She has had a partial avulsion of the nail plate due to a traumatic injury.  I recommended avulsion of the remaining nail plate where it is attached today.  Following consent and digital block with lidocaine and Marcaine the right hallux was prepped with Betadine and a tourniquet applied around the base of the toe.  A Freer elevator was used to avulsed the remaining distal nail plate it was irrigated with alcohol and dressed with bacitracin and a bandage.  Post care instructions given  Discussed the etiology and treatment options for plantar fasciitis including stretching, formal physical therapy, supportive shoegears such as a running shoe or sneaker, pre fabricated orthoses, injection therapy, and oral medications. We also discussed the role of surgical treatment of this for patients who do not improve after exhausting non-surgical treatment options.   -Educated patient on  stretching and icing of the affected limb -Rx for meloxicam. Educated on use, risks and benefits of the medication -Follow-up in 1 month, if not improving recommend injection at that point.  As needed if it resolves before then   Return in about 1 month (around 07/07/2022) for recheck plantar fasciitis, nail re-check.

## 2022-06-06 NOTE — Patient Instructions (Addendum)
Soak Instructions    THE DAY AFTER THE PROCEDURE  Place 1/4 cup of epsom salts (or betadine, or white vinegar) in a quart of warm tap water.  Submerge your foot or feet with outer bandage intact for the initial soak; this will allow the bandage to become moist and wet for easy lift off.  Once you remove your bandage, continue to soak in the solution for 20 minutes.  This soak should be done twice a day.  Next, remove your foot or feet from solution, blot dry the affected area and cover.  You may use a band aid large enough to cover the area or use gauze and tape.  Apply other medications to the area as directed by the doctor such as polysporin neosporin.  IF YOUR SKIN BECOMES IRRITATED WHILE USING THESE INSTRUCTIONS, IT IS OKAY TO SWITCH TO  WHITE VINEGAR AND WATER. Or you may use antibacterial soap and water to keep the toe clean  Monitor for any signs/symptoms of infection. Call the office immediately if any occur or go directly to the emergency room. Call with any questions/concerns.    Plantar Fasciitis (Heel Spur Syndrome) with Rehab The plantar fascia is a fibrous, ligament-like, soft-tissue structure that spans the bottom of the foot. Plantar fasciitis is a condition that causes pain in the foot due to inflammation of the tissue. SYMPTOMS  Pain and tenderness on the underneath side of the foot. Pain that worsens with standing or walking. CAUSES  Plantar fasciitis is caused by irritation and injury to the plantar fascia on the underneath side of the foot. Common mechanisms of injury include: Direct trauma to bottom of the foot. Damage to a small nerve that runs under the foot where the main fascia attaches to the heel bone. Stress placed on the plantar fascia due to bone spurs. RISK INCREASES WITH:  Activities that place stress on the plantar fascia (running, jumping, pivoting, or cutting). Poor strength and flexibility. Improperly fitted shoes. Tight calf muscles. Flat  feet. Failure to warm-up properly before activity. Obesity. PREVENTION Warm up and stretch properly before activity. Allow for adequate recovery between workouts. Maintain physical fitness: Strength, flexibility, and endurance. Cardiovascular fitness. Maintain a health body weight. Avoid stress on the plantar fascia. Wear properly fitted shoes, including arch supports for individuals who have flat feet.  PROGNOSIS  If treated properly, then the symptoms of plantar fasciitis usually resolve without surgery. However, occasionally surgery is necessary.  RELATED COMPLICATIONS  Recurrent symptoms that may result in a chronic condition. Problems of the lower back that are caused by compensating for the injury, such as limping. Pain or weakness of the foot during push-off following surgery. Chronic inflammation, scarring, and partial or complete fascia tear, occurring more often from repeated injections.  TREATMENT  Treatment initially involves the use of ice and medication to help reduce pain and inflammation. The use of strengthening and stretching exercises may help reduce pain with activity, especially stretches of the Achilles tendon. These exercises may be performed at home or with a therapist. Your caregiver may recommend that you use heel cups of arch supports to help reduce stress on the plantar fascia. Occasionally, corticosteroid injections are given to reduce inflammation. If symptoms persist for greater than 6 months despite non-surgical (conservative), then surgery may be recommended.   MEDICATION  If pain medication is necessary, then nonsteroidal anti-inflammatory medications, such as aspirin and ibuprofen, or other minor pain relievers, such as acetaminophen, are often recommended. Do not take pain medication within  7 days before surgery. Prescription pain relievers may be given if deemed necessary by your caregiver. Use only as directed and only as much as you  need. Corticosteroid injections may be given by your caregiver. These injections should be reserved for the most serious cases, because they may only be given a certain number of times.  HEAT AND COLD Cold treatment (icing) relieves pain and reduces inflammation. Cold treatment should be applied for 10 to 15 minutes every 2 to 3 hours for inflammation and pain and immediately after any activity that aggravates your symptoms. Use ice packs or massage the area with a piece of ice (ice massage). Heat treatment may be used prior to performing the stretching and strengthening activities prescribed by your caregiver, physical therapist, or athletic trainer. Use a heat pack or soak the injury in warm water.  SEEK IMMEDIATE MEDICAL CARE IF: Treatment seems to offer no benefit, or the condition worsens. Any medications produce adverse side effects.  EXERCISES- RANGE OF MOTION (ROM) AND STRETCHING EXERCISES - Plantar Fasciitis (Heel Spur Syndrome) These exercises may help you when beginning to rehabilitate your injury. Your symptoms may resolve with or without further involvement from your physician, physical therapist or athletic trainer. While completing these exercises, remember:  Restoring tissue flexibility helps normal motion to return to the joints. This allows healthier, less painful movement and activity. An effective stretch should be held for at least 30 seconds. A stretch should never be painful. You should only feel a gentle lengthening or release in the stretched tissue.  RANGE OF MOTION - Toe Extension, Flexion Sit with your right / left leg crossed over your opposite knee. Grasp your toes and gently pull them back toward the top of your foot. You should feel a stretch on the bottom of your toes and/or foot. Hold this stretch for 10 seconds. Now, gently pull your toes toward the bottom of your foot. You should feel a stretch on the top of your toes and or foot. Hold this stretch for 10  seconds. Repeat  times. Complete this stretch 3 times per day.   RANGE OF MOTION - Ankle Dorsiflexion, Active Assisted Remove shoes and sit on a chair that is preferably not on a carpeted surface. Place right / left foot under knee. Extend your opposite leg for support. Keeping your heel down, slide your right / left foot back toward the chair until you feel a stretch at your ankle or calf. If you do not feel a stretch, slide your bottom forward to the edge of the chair, while still keeping your heel down. Hold this stretch for 10 seconds. Repeat 3 times. Complete this stretch 2 times per day.   STRETCH  Gastroc, Standing Place hands on wall. Extend right / left leg, keeping the front knee somewhat bent. Slightly point your toes inward on your back foot. Keeping your right / left heel on the floor and your knee straight, shift your weight toward the wall, not allowing your back to arch. You should feel a gentle stretch in the right / left calf. Hold this position for 10 seconds. Repeat 3 times. Complete this stretch 2 times per day.  STRETCH  Soleus, Standing Place hands on wall. Extend right / left leg, keeping the other knee somewhat bent. Slightly point your toes inward on your back foot. Keep your right / left heel on the floor, bend your back knee, and slightly shift your weight over the back leg so that you feel a gentle  stretch deep in your back calf. Hold this position for 10 seconds. Repeat 3 times. Complete this stretch 2 times per day.  STRETCH  Gastrocsoleus, Standing  Note: This exercise can place a lot of stress on your foot and ankle. Please complete this exercise only if specifically instructed by your caregiver.  Place the ball of your right / left foot on a step, keeping your other foot firmly on the same step. Hold on to the wall or a rail for balance. Slowly lift your other foot, allowing your body weight to press your heel down over the edge of the step. You should  feel a stretch in your right / left calf. Hold this position for 10 seconds. Repeat this exercise with a slight bend in your right / left knee. Repeat 3 times. Complete this stretch 2 times per day.   STRENGTHENING EXERCISES - Plantar Fasciitis (Heel Spur Syndrome)  These exercises may help you when beginning to rehabilitate your injury. They may resolve your symptoms with or without further involvement from your physician, physical therapist or athletic trainer. While completing these exercises, remember:  Muscles can gain both the endurance and the strength needed for everyday activities through controlled exercises. Complete these exercises as instructed by your physician, physical therapist or athletic trainer. Progress the resistance and repetitions only as guided.  STRENGTH - Towel Curls Sit in a chair positioned on a non-carpeted surface. Place your foot on a towel, keeping your heel on the floor. Pull the towel toward your heel by only curling your toes. Keep your heel on the floor. Repeat 3 times. Complete this exercise 2 times per day.  STRENGTH - Ankle Inversion Secure one end of a rubber exercise band/tubing to a fixed object (table, pole). Loop the other end around your foot just before your toes. Place your fists between your knees. This will focus your strengthening at your ankle. Slowly, pull your big toe up and in, making sure the band/tubing is positioned to resist the entire motion. Hold this position for 10 seconds. Have your muscles resist the band/tubing as it slowly pulls your foot back to the starting position. Repeat 3 times. Complete this exercises 2 times per day.  Document Released: 03/12/2005 Document Revised: 06/04/2011 Document Reviewed: 06/24/2008 [facility] Patient Information 2014 [place], [place].

## 2022-06-13 ENCOUNTER — Encounter: Payer: Self-pay | Admitting: Podiatry

## 2022-06-18 ENCOUNTER — Ambulatory Visit: Payer: 59 | Attending: Hematology and Oncology

## 2022-06-18 VITALS — Wt 155.5 lb

## 2022-06-18 DIAGNOSIS — Z483 Aftercare following surgery for neoplasm: Secondary | ICD-10-CM | POA: Insufficient documentation

## 2022-06-18 NOTE — Therapy (Signed)
OUTPATIENT PHYSICAL THERAPY SOZO SCREENING NOTE   Patient Name: Veronica Santos MRN: [id number] DOB:1971/02/03, 52 y.o., female Today's Date: 06/18/2022  PCP: Gaynelle Arabian, MD REFERRING PROVIDER: Nicholas Lose, MD   PT End of Session - 06/18/22 1011     Visit Number 3   # unchanged due to screen only   PT Start Time 1009    PT Stop Time 1013    PT Time Calculation (min) 4 min    Activity Tolerance Patient tolerated treatment well    Behavior During Therapy WFL for tasks assessed/performed             Past Medical History:  Diagnosis Date   Breast cancer ([place])    Cancer ([place])    Family history of breast cancer    Family history of colon cancer    Family history of prostate cancer    Personal history of radiation therapy    Pneumonia    Past Surgical History:  Procedure Laterality Date   AXILLARY SENTINEL NODE BIOPSY Right 03/23/2021   Procedure: RIGHT AXILLARY SENTINEL NODE BIOPSY WITH MAGTRACE INJECTION;  Surgeon: Jovita Kussmaul, MD;  Location: [place];  Service: General;  Laterality: Right;   BREAST BIOPSY Left 02/08/2021   BREAST BIOPSY Right 02/08/2021   BREAST EXCISIONAL BIOPSY     BREAST LUMPECTOMY     BREAST LUMPECTOMY WITH RADIOACTIVE SEED AND SENTINEL LYMPH NODE BIOPSY Right 03/23/2021   Procedure: RIGHT BREAST LUMPECTOMY WITH RADIOACTIVE SEED;  Surgeon: Jovita Kussmaul, MD;  Location: [place];  Service: General;  Laterality: Right;   BREAST LUMPECTOMY WITH RADIOACTIVE SEED LOCALIZATION Left 03/23/2021   Procedure: LEFT BREAST LUMPECTOMY WITH RADIOACTIVE SEED LOCALIZATION;  Surgeon: Jovita Kussmaul, MD;  Location: [place];  Service: General;  Laterality: Left;   CESAREAN SECTION     X2   CHOLECYSTECTOMY     TONSILLECTOMY     Patient Active Problem List   Diagnosis Date Noted   Genetic testing 04/03/2021   Family history of breast cancer 03/13/2021   Family history of colon cancer 03/13/2021   Family history of prostate cancer 03/13/2021   Malignant neoplasm  of lower-outer quadrant of right breast of female, estrogen receptor positive ([place]) 02/21/2021   Pain in right knee 03/09/2019    REFERRING DIAG: right breast cancer at risk for lymphedema  THERAPY DIAG: Aftercare following surgery for neoplasm  PERTINENT HISTORY: 02/08/2021 breast biopsy for Rt breast cancer which is ER 100%, PR 100%, Ki-67 15%, HER2 equivocal by IHC Rt lumpectomy with SLN 03/23/2021 with 0/7 LN removed. No chemotherapy but will have radiation.  Pt also with non cancerous mass on right breast that will be removed with no lymph node removal   PRECAUTIONS: right UE Lymphedema risk, None  SUBJECTIVE: Pt returns for her 3 month L-Dex screen.   PAIN:  Are you having pain? No  SOZO SCREENING: Patient was assessed today using the SOZO machine to determine the lymphedema index score. This was compared to her baseline score. It was determined that she is within the recommended range when compared to her baseline and no further action is needed at this time. She will continue SOZO screenings. These are done every 3 months for 2 years post operatively followed by every 6 months for 2 years, and then annually.   L-DEX FLOWSHEETS - 06/18/22 1000       L-DEX LYMPHEDEMA SCREENING   Measurement Type Unilateral    L-DEX MEASUREMENT EXTREMITY Upper Extremity    POSITION  Standing    DOMINANT SIDE Left    At Risk Side Right    BASELINE SCORE (UNILATERAL) 5.2    L-DEX SCORE (UNILATERAL) 5.6    VALUE CHANGE (UNILAT) 0.4              [name], PTA 06/18/2022, 10:12 AM

## 2022-07-10 ENCOUNTER — Ambulatory Visit: Payer: Managed Care, Other (non HMO) | Admitting: Podiatry

## 2022-07-10 DIAGNOSIS — M722 Plantar fascial fibromatosis: Secondary | ICD-10-CM

## 2022-07-10 NOTE — Progress Notes (Signed)
  Subjective:  Patient ID: Veronica Santos, female    DOB: 1970-12-26,  MRN: [id number]  Chief Complaint  Patient presents with   Nail Problem    Nail check - right, looks great   Tendonitis    4 week follow up right lower leg - still quite painful    52 y.o. female presents with the above complaint. History confirmed with patient.  She returns for follow-up the nail is doing better the heel is still quite painful, she is done the exercises which so far have not helped to alleviate this much, she use the Mobic but it made her somewhat nauseous. Objective:  Physical Exam: warm, good capillary refill, no trophic changes or ulcerative lesions, normal DP and PT pulses, normal sensory exam Left Foot: point tenderness over the heel pad Right Foot: Nail site healing well no signs of infection  Assessment:   1. Plantar fasciitis of left foot      Plan:  Patient was evaluated and treated and all questions answered.  Nail removal site is healing well can leave open to air and discontinue soaks and ointment.   -Continue home exercise plan for plantar fasciitis -Recommended corticosteroid injection as noted below  After sterile prep with povidone-iodine solution and alcohol, the left heel was injected with 0.5cc 2% xylocaine plain, 0.5cc 0.5% marcaine plain, 5mg  triamcinolone acetonide, and 2mg  dexamethasone was injected along the medial plantar fascia at the insertion on the plantar calcaneus. The patient tolerated the procedure well without complication.   Return if symptoms worsen or fail to improve.

## 2022-08-01 ENCOUNTER — Ambulatory Visit
Admission: RE | Admit: 2022-08-01 | Discharge: 2022-08-01 | Disposition: A | Discharge status: Home / Self Care | Payer: 59 | Source: Ambulatory Visit | Attending: Adult Health | Admitting: Adult Health

## 2022-08-01 ENCOUNTER — Telehealth: Payer: Self-pay

## 2022-08-01 DIAGNOSIS — Z170 Estrogen receptor positive status [ER+]: Secondary | ICD-10-CM

## 2022-08-01 MED ORDER — GADOPICLENOL 0.5 MMOL/ML IV SOLN
7.0000 mL | Freq: Once | INTRAVENOUS | Status: AC | PRN
  Administered 2022-08-01: 7 mL via INTRAVENOUS

## 2022-08-01 NOTE — Telephone Encounter (Signed)
Called pt to let her know that MRI results is normal and noted no cancer. Pt was very happy.

## 2022-08-01 NOTE — Telephone Encounter (Signed)
-----   Message from Loa Socks, NP sent at 08/01/2022  1:11 PM EDT ----- Please call patient and let her know that her MRI is normal.  No cancer is noted.  This is great news.  :) ----- Message ----- From: Interface, Rad Results In Sent: 08/01/2022   1:02 PM EDT To: Loa Socks, NP

## 2022-09-17 ENCOUNTER — Ambulatory Visit: Payer: 59 | Attending: Hematology and Oncology

## 2022-09-17 DIAGNOSIS — Z483 Aftercare following surgery for neoplasm: Secondary | ICD-10-CM | POA: Insufficient documentation

## 2022-10-22 ENCOUNTER — Ambulatory Visit: Payer: Managed Care, Other (non HMO) | Admitting: Podiatry

## 2023-01-16 ENCOUNTER — Other Ambulatory Visit: Payer: Self-pay | Admitting: Hematology and Oncology

## 2023-01-16 DIAGNOSIS — Z9889 Other specified postprocedural states: Secondary | ICD-10-CM

## 2023-01-31 ENCOUNTER — Other Ambulatory Visit (HOSPITAL_COMMUNITY): Payer: Self-pay | Admitting: Obstetrics and Gynecology

## 2023-01-31 DIAGNOSIS — R011 Cardiac murmur, unspecified: Secondary | ICD-10-CM

## 2023-02-11 IMAGING — MR MR BREAST BX W LOC DEV 1ST LESION IMAGE BX SPEC MR GUIDE*L*
8 of 12 series · 32 of 48 positions shown · IV contrast (7 ml gadavist)
Comparison: Previous exams.
COMPARISON: Previous exams.

Addendum:
CLINICAL DATA: 11 mm indeterminate mass in the lower outer quadrant
of the right breast and 9 mm indeterminate mass in the retroareolar
left breast on a recent high risk screening MRI.

EXAM:
MRI GUIDED CORE NEEDLE BIOPSIES OF BOTH BREASTS
TECHNIQUE: Multiplanar, multisequence MR imaging of both breasts was performed
both before and after administration of intravenous contrast.
CONTRAST:  7mL GADAVIST GADOBUTROL 1 MMOL/ML IV SOLN

[Series 2: fiducial bilateral · sagittal · 2.0mm · 1.33mm/px · 4 of 144 slices shown]
[im 1/144]
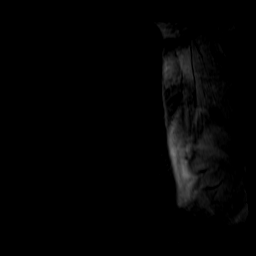
[im 48/144]
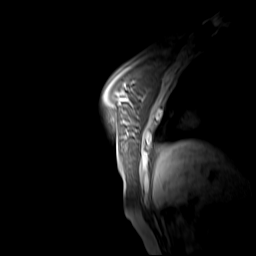
[im 96/144]
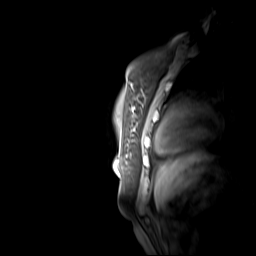
[im 144/144]
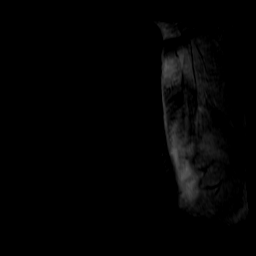

[Series 3: dynamic pre · axial · non-contrast · 1.3mm · 0.73mm/px · z∈[-86,+100]mm · 4 of 144 slices shown]
[im 1/144]
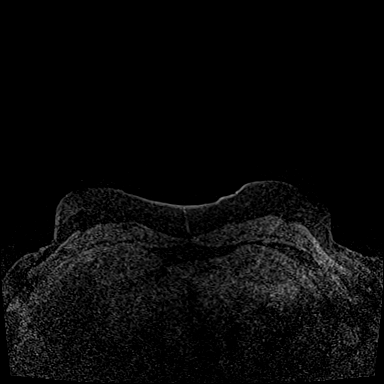
[im 48/144]
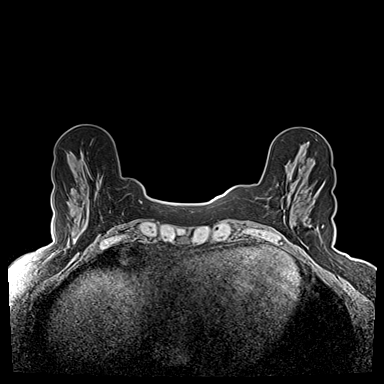
[im 96/144]
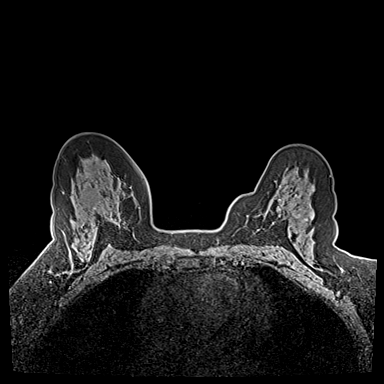
[im 144/144]
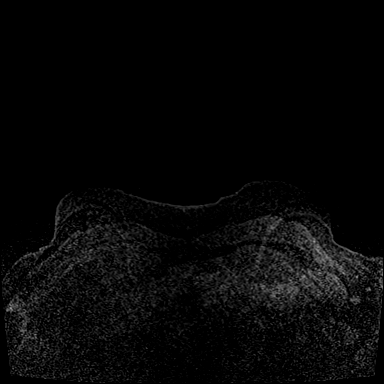

[Series 4: dynamic post 20 · axial · 1.3mm · 0.73mm/px · z∈[-86,+100]mm · 4 of 144 slices shown (1 of 2)]
[im 1/144]
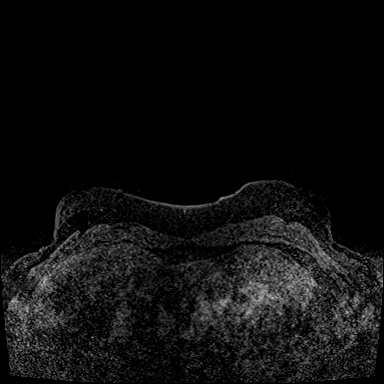
[im 48/144]
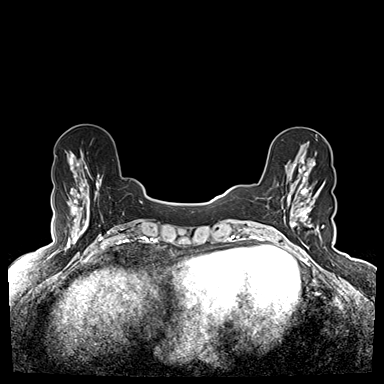
[im 96/144]
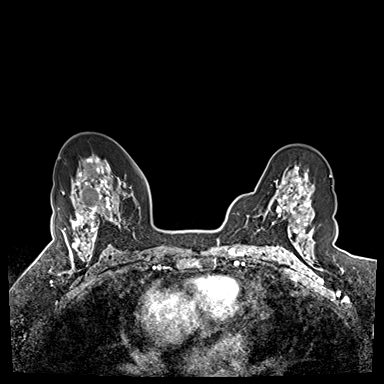
[im 144/144]
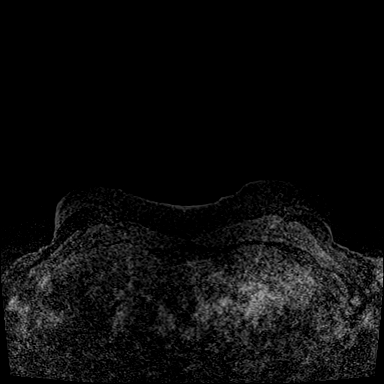

[Series 5: dynamic post 20 · axial · 1.3mm · 0.73mm/px · z∈[-86,+100]mm · 4 of 144 slices shown (2 of 2)]
[im 1/144]
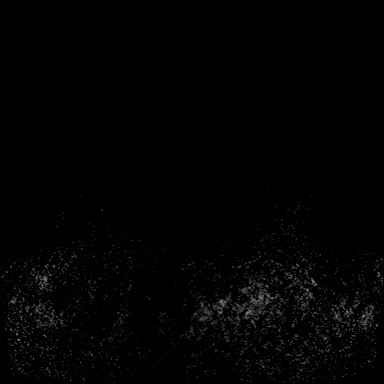
[im 48/144]
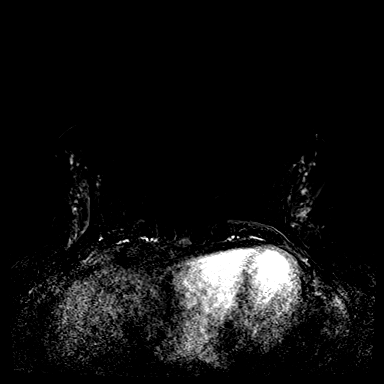
[im 96/144]
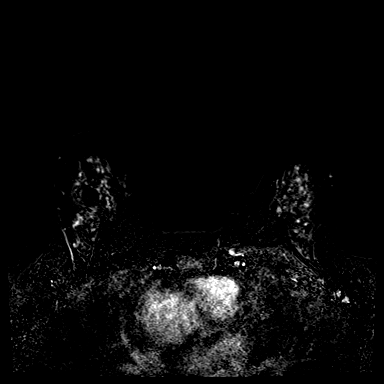
[im 144/144]
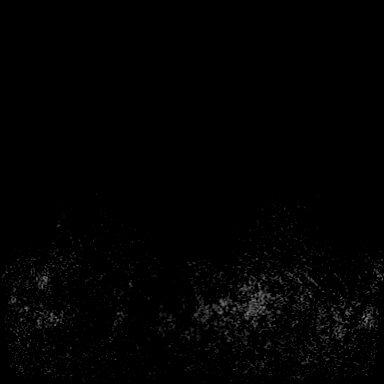

[Series 6: dynamic post 3 · axial · 1.3mm · 0.73mm/px · z∈[-86,+100]mm · 4 of 144 slices shown (1 of 2)]
[im 1/144]
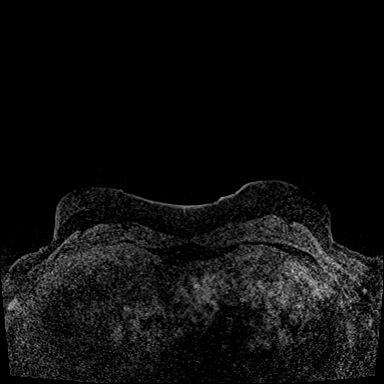
[im 48/144]
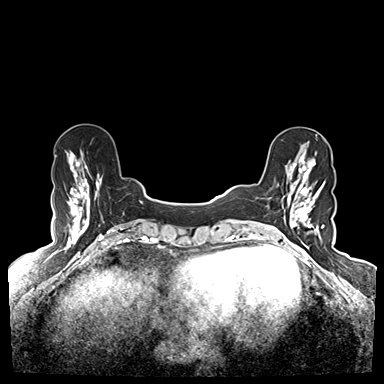
[im 96/144]
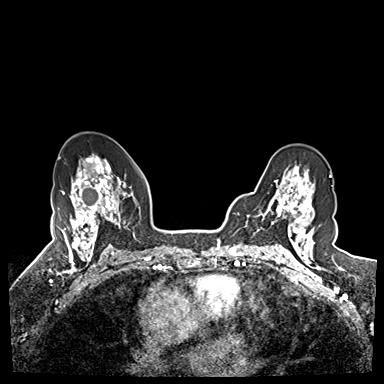
[im 144/144]
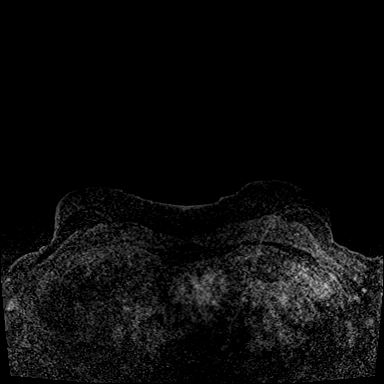

[Series 7: dynamic post 3 · axial · 1.3mm · 0.73mm/px · z∈[-86,+100]mm · 4 of 144 slices shown (2 of 2)]
[im 1/144]
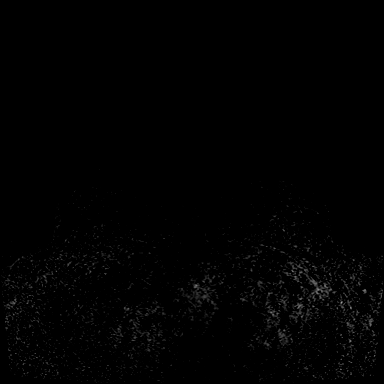
[im 48/144]
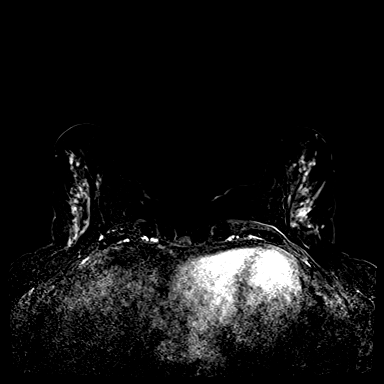
[im 96/144]
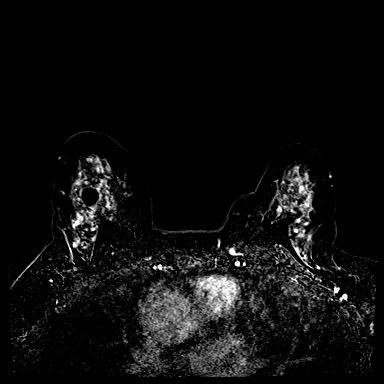
[im 144/144]
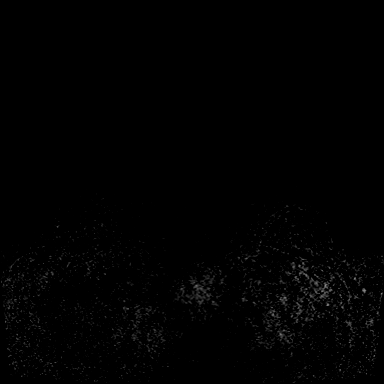

[Series 8: dynamic post 5 · axial · 1.3mm · 0.73mm/px · z∈[-86,+100]mm · 4 of 144 slices shown (1 of 2)]
[im 1/144]
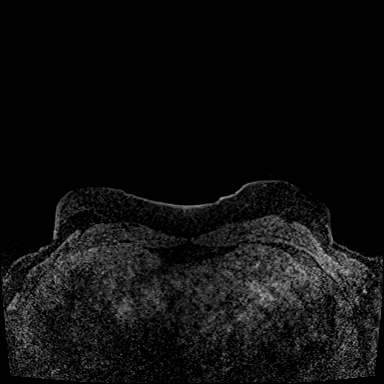
[im 48/144]
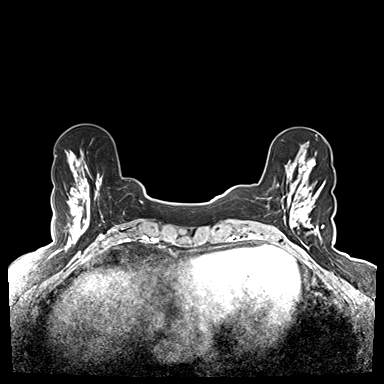
[im 96/144]
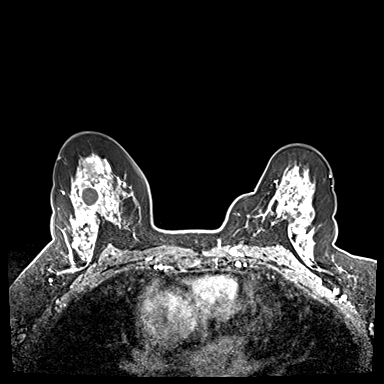
[im 144/144]
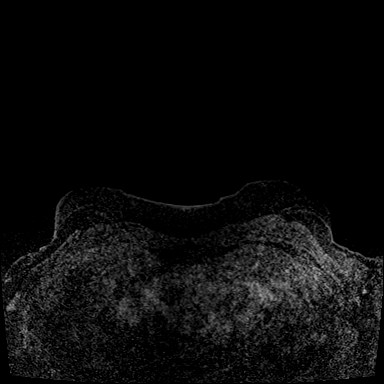

[Series 9: dynamic post 5 · axial · 1.3mm · 0.73mm/px · z∈[-86,+100]mm · 4 of 144 slices shown (2 of 2)]
[im 1/144]
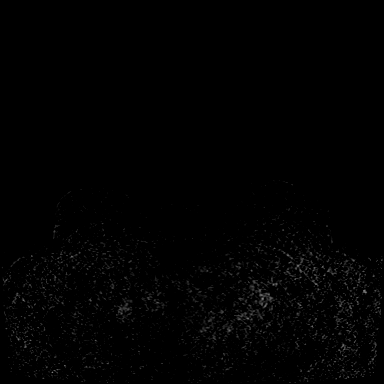
[im 48/144]
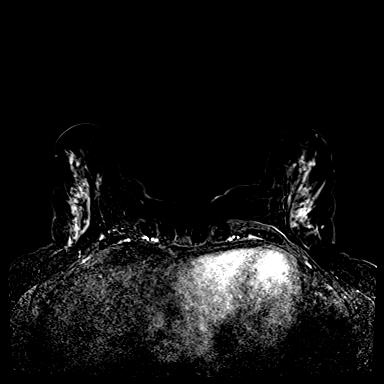
[im 96/144]
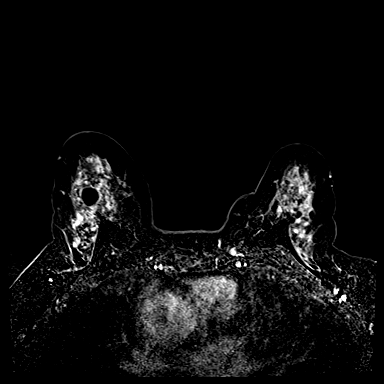
[im 144/144]
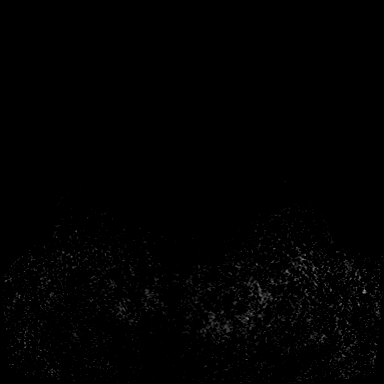

[32 of 48 positions shown; findings below may reference images not displayed]

FINDINGS: I met with the patient, and we discussed the procedure of MRI guided
biopsy, including risks, benefits, and alternatives. Specifically,
we discussed the risks of infection, bleeding, tissue injury, clip
migration, and inadequate sampling. Informed, written consent was
given. The usual time out protocol was performed immediately prior
to the procedures.

SITE 1: 11 MM MASS IN THE LOWER OUTER QUADRANT OF THE RIGHT BREAST

Using sterile technique, 1% Lidocaine, MRI guidance, and a 9 gauge
vacuum assisted device, biopsy was performed of the recently
demonstrated 11 mm mass in the lower outer quadrant of the right
breast using a lateral approach. At the conclusion of the procedure,
a cylinder shaped tissue marker clip was deployed into the biopsy
cavity. Follow-up 2-view mammogram was performed and dictated
separately.

SITE 2: 9 MM MASS IN THE RETROAREOLAR LEFT BREAST

Using sterile technique, 1% Lidocaine, MRI guidance, and a 9 gauge
vacuum assisted device, biopsy was performed of the recently
demonstrated 9 mm mass in the lateral retroareolar left breast using
a lateral approach. At the conclusion of the procedure, a cylinder
shaped tissue marker clip was deployed into the biopsy cavity.
Follow-up 2-view mammogram was performed and dictated separately.
IMPRESSION: MRI guided biopsies of both breasts.  No apparent complications.

ADDENDUM:
Pathology revealed GRADE II INVASIVE MAMMARY CARCINOMA, MAMMARY
CARCINOMA IN SITU, CALCIFICATIONS of the RIGHT breast, lower outer
quadrant, (cylinder clip). This was found to be concordant by Dr.
Tri Sayers.

Pathology revealed TUBULAR ADENOMA of the LEFT breast, retroareolar,
(cylinder clip). This was found to be concordant by Dr. Tri Sayers.

Pathology results were discussed with the patient by telephone. The
patient reported doing well after the biopsies with tenderness at
BILATERAL sites, and bleeding and bruising on the RIGHT. Post biopsy
instructions and care were reviewed and questions were answered. The
patient was encouraged to call [REDACTED] for any additional concerns. My direct phone number was
provided.

Surgical consultation has been arranged with Dr. Nipa Vaccaro, per
patient request, at [REDACTED] on February 21, 2021.

Pathology results reported by Uyai Sanctus, RN on 02/09/2021.

*** End of Addendum ***
FINDINGS: I met with the patient, and we discussed the procedure of MRI guided
biopsy, including risks, benefits, and alternatives. Specifically,
we discussed the risks of infection, bleeding, tissue injury, clip
migration, and inadequate sampling. Informed, written consent was
given. The usual time out protocol was performed immediately prior
to the procedures.

SITE 1: 11 MM MASS IN THE LOWER OUTER QUADRANT OF THE RIGHT BREAST

Using sterile technique, 1% Lidocaine, MRI guidance, and a 9 gauge
vacuum assisted device, biopsy was performed of the recently
demonstrated 11 mm mass in the lower outer quadrant of the right
breast using a lateral approach. At the conclusion of the procedure,
a cylinder shaped tissue marker clip was deployed into the biopsy
cavity. Follow-up 2-view mammogram was performed and dictated
separately.

SITE 2: 9 MM MASS IN THE RETROAREOLAR LEFT BREAST

Using sterile technique, 1% Lidocaine, MRI guidance, and a 9 gauge
vacuum assisted device, biopsy was performed of the recently
demonstrated 9 mm mass in the lateral retroareolar left breast using
a lateral approach. At the conclusion of the procedure, a cylinder
shaped tissue marker clip was deployed into the biopsy cavity.
Follow-up 2-view mammogram was performed and dictated separately.
IMPRESSION: MRI guided biopsies of both breasts.  No apparent complications.

## 2023-02-15 ENCOUNTER — Ambulatory Visit (HOSPITAL_BASED_OUTPATIENT_CLINIC_OR_DEPARTMENT_OTHER): Payer: 59

## 2023-02-15 DIAGNOSIS — R011 Cardiac murmur, unspecified: Secondary | ICD-10-CM

## 2023-02-15 LAB — ECHOCARDIOGRAM COMPLETE
AR max vel: 3.12 cm2
AV Area VTI: 2.67 cm2
AV Area mean vel: 2.93 cm2
AV Mean grad: 4 mm[Hg]
AV Peak grad: 8 mm[Hg]
AV Vena cont: 0.2 cm
Ao pk vel: 1.41 m/s
Area-P 1/2: 3.97 cm2
P 1/2 time: 721 ms
S' Lateral: 3.15 cm

## 2023-02-28 ENCOUNTER — Ambulatory Visit
Admission: RE | Admit: 2023-02-28 | Discharge: 2023-02-28 | Disposition: A | Discharge status: Home / Self Care | Payer: 59 | Source: Ambulatory Visit | Attending: Hematology and Oncology | Admitting: Hematology and Oncology

## 2023-02-28 DIAGNOSIS — Z9889 Other specified postprocedural states: Secondary | ICD-10-CM

## 2023-03-04 ENCOUNTER — Inpatient Hospital Stay: Payer: 59 | Attending: Hematology and Oncology | Admitting: Hematology and Oncology

## 2023-03-04 VITALS — BP 119/73 | HR 73 | Temp 98.5°F | Resp 18 | Ht 67.0 in | Wt 151.0 lb

## 2023-03-04 DIAGNOSIS — Z79810 Long term (current) use of selective estrogen receptor modulators (SERMs): Secondary | ICD-10-CM | POA: Diagnosis not present

## 2023-03-04 DIAGNOSIS — C50511 Malignant neoplasm of lower-outer quadrant of right female breast: Secondary | ICD-10-CM | POA: Insufficient documentation

## 2023-03-04 DIAGNOSIS — Z170 Estrogen receptor positive status [ER+]: Secondary | ICD-10-CM | POA: Diagnosis not present

## 2023-03-04 MED ORDER — TAMOXIFEN CITRATE 20 MG PO TABS: 20.0000 mg | ORAL_TABLET | Freq: Every day | ORAL | 3 refills | Status: DC

## 2023-03-04 NOTE — Progress Notes (Signed)
Patient Care Team: Blair Heys, MD (Inactive) as PCP - General (Family Medicine) Serena Croissant, MD as Consulting Physician (Hematology and Oncology) Griselda Miner, MD as Consulting Physician (General Surgery) Lonie Peak, MD as Attending Physician (Radiation Oncology)  DIAGNOSIS:  Encounter Diagnosis  Name Primary?   Malignant neoplasm of lower-outer quadrant of right breast of female, estrogen receptor positive (HCC) Yes    SUMMARY OF ONCOLOGIC HISTORY: Oncology History  Malignant neoplasm of lower-outer quadrant of right breast of female, estrogen receptor positive (HCC)  02/21/2021 Initial Diagnosis   MRI Breast: a dominant mass in the inferolateral right breast, and a mass in the left retroareolar breast. Biopsy: invasive mammary carcinoma and mammary carcinoma in situ with calcifications in the right breast, and no malignancy in the left breast.    02/22/2021 Cancer Staging   Staging form: Breast, AJCC 8th Edition - Clinical stage from 02/22/2021: Stage IA (cT1c, cN0, cM0, G2, ER+, PR+, HER2-) - Signed by Serena Croissant, MD on 02/22/2021 Stage prefix: Initial diagnosis Histologic grading system: 3 grade system    Genetic Testing   Negative genetic testing. No pathogenic variants identified on the [facility] CustomNext+RNA panel. The report date is 03/31/2021.  The CustomNext-Cancer + RNAinsight panel includes sequencing and/or deletion duplication testing of the following 47 genes: APC, ATM, AXIN2, BARD1, BMPR1A, BRCA1, BRCA2, BRIP1, CDH1, CDKN2A (p14ARF), CDKN2A (p16INK4a), CKD4, CHEK2, CTNNA1, DICER1, EPCAM (Deletion/duplication testing only), GREM1 (promoter region deletion/duplication testing only), KIT, MEN1, MLH1, MSH2, MSH3, MSH6, MUTYH, NBN, NF1, NHTL1, PALB2, PDGFRA, PMS2, POLD1, POLE, PTEN, RAD50, RAD51C, RAD51D, SDHB, SDHC, SDHD, SMAD4, SMARCA4. STK11, TP53, TSC1, TSC2, and VHL.  The following genes were evaluated for sequence changes only: SDHA and HOXB13 c.251G>A  variant only.    03/23/2021 Surgery   Left lumpectomy: Intraductal papilloma, CSL and UDH Right lumpectomy: 0.4 cm grade 1 IDC ER 100%, PR 100%, HER2 negative, Ki-67 15%, 0/7 lymph nodes negative    05/01/2021 - 05/29/2021 Radiation Therapy   Site Technique Total Dose (Gy) Dose per Fx (Gy) Completed Fx Beam Energies  Breast, Right: Breast_R 3D 40.05/40.05 2.67 15/15 6X  Breast, Right: Breast_R_Bst 3D 10/10 2 5/5 6X, 10X       CHIEF COMPLIANT: Follow-up on tamoxifen therapy  HISTORY OF PRESENT ILLNESS:   History of Present Illness   The patient, with a history of breast cancer, is nearly two years post-surgery and has been on tamoxifen since March. She reports joint pain in her hands, which she describes as soreness rather than cramping. The pain is present throughout the day and affects her ability to grasp objects for extended periods. She is unsure if this is a side effect of the tamoxifen or related to hormonal changes. She denies experiencing hot flashes and continues to menstruate regularly. The patient admits to occasionally forgetting to take her medication, as it is the only one she is prescribed. She expresses curiosity about the duration of her tamoxifen treatment and the frequency of her MRI screenings.         ALLERGIES:  is allergic to penicillins, other, and sulfa antibiotics.  MEDICATIONS:  Current Outpatient Medications  Medication Sig Dispense Refill   Multiple Vitamins-Minerals (MULTIVITAMIN WITH MINERALS) tablet Take 1 tablet by mouth daily.     tamoxifen (NOLVADEX) 20 MG tablet Take 1 tablet (20 mg total) by mouth daily. 90 tablet 3   No current facility-administered medications for this visit.    PHYSICAL EXAMINATION: ECOG PERFORMANCE STATUS: 1 - Symptomatic but completely ambulatory  Vitals:  03/04/23 1136  BP: 119/73  Pulse: 73  Resp: 18  Temp: 98.5 F (36.9 C)  SpO2: 99%   Filed Weights   03/04/23 1136  Weight: 151 lb (68.5 kg)     LABORATORY  DATA:  I have reviewed the data as listed     No data to display          Lab Results  Component Value Date   WBC 6.4 03/10/2021   HGB 13.6 03/10/2021   HCT 40.1 03/10/2021   MCV 92.8 03/10/2021   PLT 293 03/10/2021    ASSESSMENT & PLAN:  Malignant neoplasm of lower-outer quadrant of right breast of female, estrogen receptor positive (HCC) 03/23/2021:Left lumpectomy: Intraductal papilloma, CSL and UDH Right lumpectomy: 0.4 cm grade 1 IDC ER 100%, PR 100%, HER2 negative, Ki-67 15%, 0/7 lymph nodes negative   Treatment plan: 1.  No role of Oncotype DX testing for the tumor that is 0.4 cm grade 1 2. adjuvant radiation completed 05/29/21 3.  Followed by adjuvant antiestrogen therapy with tamoxifen (patient is still premenopausal) started 05/25/2021   Tamoxifen toxicities: Initially she had hot flashes but eventually these have subsided and she does not seem to have any side effects.  She is still premenopausal she had her last menstrual cycle about a month ago.   Breast cancer surveillance: Mammogram 02/28/2023: Benign breast density category C   Breast Density Category C on mammogram, but Category D on MRI. -Continue alternating mammogram and MRI annually due to high breast density. -Next year, transition to screening mammograms.  Follow-up -Schedule annual follow-up appointment. -At five-year mark, BCI will be done to determine need for continued Tamoxifen therapy.          Orders Placed This Encounter  Procedures   MR BREAST BILATERAL W WO CONTRAST INC CAD    Standing Status:   Future    Standing Expiration Date:   03/03/2024    Order Specific Question:   If indicated for the ordered procedure, I authorize the administration of contrast media per Radiology protocol    Answer:   Yes    Order Specific Question:   What is the patient's sedation requirement?    Answer:   No Sedation    Order Specific Question:   Does the patient have a pacemaker or implanted devices?     Answer:   No    Order Specific Question:   Preferred imaging location?    Answer:   [facility] (table limit-550lbs)    Order Specific Question:   Release to patient    Answer:   Immediate   The patient has a good understanding of the overall plan. she agrees with it. she will call with any problems that may develop before the next visit here. Total time spent: 30 mins including face to face time and time spent for planning, charting and co-ordination of care   Tamsen Meek, MD 03/04/23

## 2023-03-04 NOTE — Assessment & Plan Note (Signed)
03/23/2021:Left lumpectomy: Intraductal papilloma, CSL and UDH Right lumpectomy: 0.4 cm grade 1 IDC ER 100%, PR 100%, HER2 negative, Ki-67 15%, 0/7 lymph nodes negative   Treatment plan: 1.  No role of Oncotype DX testing for the tumor that is 0.4 cm grade 1 2. adjuvant radiation completed 05/29/21 3.  Followed by adjuvant antiestrogen therapy with tamoxifen (patient is still premenopausal) started 05/25/2021   Tamoxifen toxicities: Initially she had hot flashes but eventually these have subsided and she does not seem to have any side effects.  She is still premenopausal she had her last menstrual cycle about a month ago.   Breast cancer surveillance: Breast exam 03/04/2023: Benign Mammogram 02/28/2023: Benign breast density category C   Return to clinic in 1 year for follow-up

## 2023-09-02 ENCOUNTER — Encounter: Payer: Self-pay | Admitting: Hematology and Oncology

## 2023-09-03 ENCOUNTER — Encounter: Payer: Self-pay | Admitting: Hematology and Oncology

## 2023-09-12 ENCOUNTER — Other Ambulatory Visit: Payer: Self-pay | Admitting: *Deleted

## 2023-09-12 ENCOUNTER — Ambulatory Visit
Admission: RE | Admit: 2023-09-12 | Discharge: 2023-09-12 | Discharge status: Home / Self Care | Source: Ambulatory Visit | Attending: Hematology and Oncology

## 2023-09-12 ENCOUNTER — Other Ambulatory Visit: Payer: Self-pay | Admitting: Hematology and Oncology

## 2023-09-12 DIAGNOSIS — Z170 Estrogen receptor positive status [ER+]: Secondary | ICD-10-CM

## 2023-09-12 DIAGNOSIS — C50511 Malignant neoplasm of lower-outer quadrant of right female breast: Secondary | ICD-10-CM

## 2023-09-12 DIAGNOSIS — N6313 Unspecified lump in the right breast, lower outer quadrant: Secondary | ICD-10-CM

## 2023-09-12 MED ORDER — GADOPICLENOL 0.5 MMOL/ML IV SOLN: 7.0000 mL | Freq: Once | INTRAVENOUS | Status: AC | PRN

## 2023-09-12 NOTE — Progress Notes (Signed)
 Per NP request, RN placed orders for right breast MRI guided biopsy based off recent breast MRI results.  Orders placed, pt notified and verbalized understanding.  Email sent to contact at the breast center to schedule.

## 2023-09-17 ENCOUNTER — Other Ambulatory Visit

## 2023-09-19 ENCOUNTER — Other Ambulatory Visit

## 2023-09-23 ENCOUNTER — Ambulatory Visit
Admission: RE | Admit: 2023-09-23 | Discharge: 2023-09-23 | Disposition: A | Discharge status: Home / Self Care | Source: Ambulatory Visit | Attending: Hematology and Oncology | Admitting: Hematology and Oncology

## 2023-09-23 DIAGNOSIS — N6313 Unspecified lump in the right breast, lower outer quadrant: Secondary | ICD-10-CM

## 2024-02-11 ENCOUNTER — Other Ambulatory Visit: Payer: Self-pay | Admitting: *Deleted

## 2024-02-11 ENCOUNTER — Encounter: Payer: Self-pay | Admitting: Hematology and Oncology

## 2024-02-11 DIAGNOSIS — Z170 Estrogen receptor positive status [ER+]: Secondary | ICD-10-CM

## 2024-02-25 ENCOUNTER — Ambulatory Visit: Payer: 59 | Admitting: Hematology and Oncology

## 2024-03-30 ENCOUNTER — Ambulatory Visit

## 2024-04-06 ENCOUNTER — Ambulatory Visit
Admission: RE | Admit: 2024-04-06 | Discharge: 2024-04-06 | Disposition: A | Source: Ambulatory Visit | Attending: Hematology and Oncology

## 2024-04-06 ENCOUNTER — Inpatient Hospital Stay: Payer: Self-pay | Attending: Hematology and Oncology | Admitting: Hematology and Oncology

## 2024-04-06 VITALS — BP 109/65 | HR 81 | Temp 98.2°F | Resp 16 | Wt 152.9 lb

## 2024-04-06 DIAGNOSIS — C50511 Malignant neoplasm of lower-outer quadrant of right female breast: Secondary | ICD-10-CM

## 2024-04-06 DIAGNOSIS — Z170 Estrogen receptor positive status [ER+]: Secondary | ICD-10-CM

## 2024-04-06 MED ORDER — TAMOXIFEN CITRATE 20 MG PO TABS: 20.0000 mg | ORAL_TABLET | Freq: Every day | ORAL | 3 refills | Status: AC

## 2024-04-06 NOTE — Assessment & Plan Note (Signed)
 03/23/2021:Left lumpectomy: Intraductal papilloma, CSL and UDH Right lumpectomy: 0.4 cm grade 1 IDC ER 100%, PR 100%, HER2 negative, Ki-67 15%, 0/7 lymph nodes negative   Treatment plan: 1.  No role of Oncotype DX testing for the tumor that is 0.4 cm grade 1 2. adjuvant radiation completed 05/29/21 3.  Followed by adjuvant antiestrogen therapy with tamoxifen  (patient is still premenopausal) started 05/25/2021   Tamoxifen  toxicities: Initially she had hot flashes but eventually these have subsided and she does not seem to have any side effects.   At 5 years we will consider doing BCI   Breast cancer surveillance: Mammogram 02/28/2023: Benign breast density category C, another mammogram scheduled for today Breast exam 04/06/2024: Benign  Return to clinic in 1 year for follow-up

## 2024-04-06 NOTE — Progress Notes (Signed)
 "  Patient Care Team: Hugh Charleston, MD (Inactive) as PCP - General (Family Medicine) Odean Potts, MD as Consulting Physician (Hematology and Oncology) Curvin Deward MOULD, MD as Consulting Physician (General Surgery) Izell Domino, MD as Attending Physician (Radiation Oncology)  DIAGNOSIS:  Encounter Diagnosis  Name Primary?   Malignant neoplasm of lower-outer quadrant of right breast of female, estrogen receptor positive (HCC) Yes    SUMMARY OF ONCOLOGIC HISTORY: Oncology History  Malignant neoplasm of lower-outer quadrant of right breast of female, estrogen receptor positive (HCC)  02/21/2021 Initial Diagnosis   MRI Breast: a dominant mass in the inferolateral right breast, and a mass in the left retroareolar breast. Biopsy: invasive mammary carcinoma and mammary carcinoma in situ with calcifications in the right breast, and no malignancy in the left breast.    02/22/2021 Cancer Staging   Staging form: Breast, AJCC 8th Edition - Clinical stage from 02/22/2021: Stage IA (cT1c, cN0, cM0, G2, ER+, PR+, HER2-) - Signed by Odean Potts, MD on 02/22/2021 Stage prefix: Initial diagnosis Histologic grading system: 3 grade system    Genetic Testing   Negative genetic testing. No pathogenic variants identified on the [facility] CustomNext+RNA panel. The report date is 03/31/2021.  The CustomNext-Cancer + RNAinsight panel includes sequencing and/or deletion duplication testing of the following 47 genes: APC, ATM, AXIN2, BARD1, BMPR1A, BRCA1, BRCA2, BRIP1, CDH1, CDKN2A (p14ARF), CDKN2A (p16INK4a), CKD4, CHEK2, CTNNA1, DICER1, EPCAM (Deletion/duplication testing only), GREM1 (promoter region deletion/duplication testing only), KIT, MEN1, MLH1, MSH2, MSH3, MSH6, MUTYH, NBN, NF1, NHTL1, PALB2, PDGFRA, PMS2, POLD1, POLE, PTEN, RAD50, RAD51C, RAD51D, SDHB, SDHC, SDHD, SMAD4, SMARCA4. STK11, TP53, TSC1, TSC2, and VHL.  The following genes were evaluated for sequence changes only: SDHA and HOXB13 c.251G>A  variant only.    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  She is about three years from diagnosis and remains in remission after right lumpectomy (03/23/2021), adjuvant radiation (completed 05/29/2021), and ongoing tamoxifen  (started 05/25/2021). She tolerates tamoxifen  without hot flashes and wishes to continue despite some reluctance. Recent imaging shows no recurrence. Her last surveillance mammogram on 02/28/2023 was benign, and prior breast MRI showed a stable cyst.  She is scheduled for a mammogram today. She has had annual breast MRIs due to category C breast density but is concerned about anxiety and stress from false positive MRI findings and biopsies. She is comfortable with reducing MRI frequency.  She is perimenopausal, with her last menstrual period in December and lengthening cycle intervals. She asks about transitioning from tamoxifen  to an aromatase inhibitor once she is fully postmenopausal. Her non-melanoma skin cancer and colonoscopy history are not affecting her current breast cancer management.      ALLERGIES:  is allergic to penicillins, other, and sulfa antibiotics.  MEDICATIONS:  Current Outpatient Medications  Medication Sig  Dispense Refill   Multiple Vitamins-Minerals (MULTIVITAMIN WITH MINERALS) tablet Take 1 tablet by mouth daily.     tamoxifen  (NOLVADEX ) 20 MG tablet Take 1 tablet (20 mg total) by mouth daily. 90 tablet 3   No current facility-administered medications for this visit.    PHYSICAL EXAMINATION: ECOG PERFORMANCE STATUS: 1 - Symptomatic but completely ambulatory  Vitals:   04/06/24 0815  BP: 109/65  Pulse: 81  Resp: 16  Temp: 98.2 F (36.8 C)  SpO2: 99%   Filed Weights   04/06/24 0815  Weight: 152 lb 14.4 oz (69.4 kg)    Physical Exam   (exam performed in the presence of a chaperone)  LABORATORY DATA:  I have reviewed the data as listed     No data to display          Lab Results  Component Value Date   WBC 6.4 03/10/2021   HGB 13.6 03/10/2021   HCT 40.1 03/10/2021   MCV 92.8 03/10/2021   PLT 293 03/10/2021    ASSESSMENT & PLAN:  Malignant neoplasm of lower-outer quadrant of right breast of female, estrogen receptor positive (HCC) 03/23/2021:Left lumpectomy: Intraductal papilloma, CSL and UDH Right lumpectomy: 0.4 cm grade 1 IDC ER 100%, PR 100%, HER2 negative, Ki-67 15%, 0/7 lymph nodes negative   Treatment plan: 1.  No role of Oncotype DX testing for the tumor that is 0.4 cm grade 1 2. adjuvant radiation completed 05/29/21 3.  Followed by adjuvant antiestrogen therapy with tamoxifen  (patient is still premenopausal) started 05/25/2021   Tamoxifen  toxicities: Initially she had hot flashes but eventually these have subsided and she does not seem to have any side effects.   At 5 years we will consider doing BCI   Breast cancer surveillance: Mammogram 02/28/2023: Benign breast density category C, another mammogram scheduled for today Breast exam 04/06/2024: Benign Recommended [facility] reveal for MRD monitoring  We had a lengthy discussion about the role of breast MRIs.  After going back-and-forth we decided not to order MRI at this year.  We will discuss it again for  next year.  If her breast density decreases then we may not need MRIs.  We also briefly discussed alternate option of contrast-enhanced mammograms.  Return to clinic in 1 year for follow-up   No orders of the defined types were placed in this encounter.  The patient has a good understanding of the overall plan. she agrees with it. she will call with any problems that may develop before the next visit here.  I personally spent a total of 30 minutes in the care of the patient today including preparing to see the patient, getting/reviewing separately obtained history, performing a medically appropriate exam/evaluation, counseling and educating, placing orders, referring and communicating with other health care professionals, documenting clinical information in the EHR, independently interpreting results, communicating results, and coordinating care.   Viinay K Jaki Steptoe, MD 04/06/2024    "

## 2024-04-07 NOTE — Progress Notes (Signed)
 Per MD [employer] Reveal order placed and faxed successfully.

## 2024-04-07 NOTE — Progress Notes (Signed)
 Guardant reveal order created and faxed to [facility] at 1 [phone].

## 2025-04-06 ENCOUNTER — Inpatient Hospital Stay: Admitting: Hematology and Oncology
